# Patient Record
Sex: Male | Born: 2000 | Race: Black or African American | Hispanic: No | Marital: Single | Smoking: Never smoker
Health system: Southern US, Community
[De-identification: ages and names within clinical notes are randomized; demographics above are authoritative.]

---
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
## 2020-08-13 ENCOUNTER — Encounter: Payer: Self-pay | Admitting: Emergency Medicine

## 2020-08-13 ENCOUNTER — Emergency Department
Admission: EM | Admit: 2020-08-13 | Discharge: 2020-08-14 | Disposition: A | Discharge status: Home / Self Care | Payer: Medicaid Other | Attending: Student in an Organized Health Care Education/Training Program | Admitting: Student in an Organized Health Care Education/Training Program

## 2020-08-13 ENCOUNTER — Other Ambulatory Visit: Payer: Self-pay

## 2020-08-13 DIAGNOSIS — Z20822 Contact with and (suspected) exposure to covid-19: Secondary | ICD-10-CM | POA: Insufficient documentation

## 2020-08-13 DIAGNOSIS — F1290 Cannabis use, unspecified, uncomplicated: Secondary | ICD-10-CM | POA: Diagnosis not present

## 2020-08-13 DIAGNOSIS — F332 Major depressive disorder, recurrent severe without psychotic features: Secondary | ICD-10-CM | POA: Diagnosis not present

## 2020-08-13 DIAGNOSIS — R45851 Suicidal ideations: Secondary | ICD-10-CM

## 2020-08-13 DIAGNOSIS — R259 Unspecified abnormal involuntary movements: Secondary | ICD-10-CM | POA: Diagnosis present

## 2020-08-13 LAB — URINE DRUG SCREEN, QUALITATIVE (ARMC ONLY)
Amphetamines, Ur Screen: NOT DETECTED
Barbiturates, Ur Screen: NOT DETECTED
Benzodiazepine, Ur Scrn: NOT DETECTED
Cannabinoid 50 Ng, Ur ~~LOC~~: POSITIVE — AB
Cocaine Metabolite,Ur ~~LOC~~: NOT DETECTED
MDMA (Ecstasy)Ur Screen: NOT DETECTED
Methadone Scn, Ur: NOT DETECTED
Opiate, Ur Screen: NOT DETECTED
Phencyclidine (PCP) Ur S: NOT DETECTED
Tricyclic, Ur Screen: NOT DETECTED

## 2020-08-13 LAB — COMPREHENSIVE METABOLIC PANEL
ALT: 55 U/L — ABNORMAL HIGH (ref 0–44)
AST: 30 U/L (ref 15–41)
Albumin: 4.5 g/dL (ref 3.5–5.0)
Alkaline Phosphatase: 86 U/L (ref 38–126)
Anion gap: 6 (ref 5–15)
BUN: 17 mg/dL (ref 6–20)
CO2: 26 mmol/L (ref 22–32)
Calcium: 9.6 mg/dL (ref 8.9–10.3)
Chloride: 107 mmol/L (ref 98–111)
Creatinine, Ser: 0.97 mg/dL (ref 0.61–1.24)
GFR, Estimated: >60 mL/min (ref >60)
Glucose, Bld: 100 mg/dL — ABNORMAL HIGH (ref 70–99)
Potassium: 4.3 mmol/L (ref 3.5–5.1)
Sodium: 139 mmol/L (ref 135–145)
Total Bilirubin: 0.7 mg/dL (ref 0.3–1.2)
Total Protein: 7.7 g/dL (ref 6.5–8.1)

## 2020-08-13 LAB — CBC
HCT: 43.3 % (ref 39.0–52.0)
Hemoglobin: 13.7 g/dL (ref 13.0–17.0)
MCH: 27.6 pg (ref 26.0–34.0)
MCHC: 31.6 g/dL (ref 30.0–36.0)
MCV: 87.1 fL (ref 80.0–100.0)
Platelets: 251 10*3/uL (ref 150–400)
RBC: 4.97 MIL/uL (ref 4.22–5.81)
RDW: 12 % (ref 11.5–15.5)
WBC: 5.2 10*3/uL (ref 4.0–10.5)
nRBC: 0 % (ref 0.0–0.2)

## 2020-08-13 LAB — ACETAMINOPHEN LEVEL: Acetaminophen (Tylenol), Serum: <10 ug/mL — ABNORMAL LOW (ref 10–30)

## 2020-08-13 LAB — SALICYLATE LEVEL: Salicylate Lvl: <7 mg/dL — ABNORMAL LOW (ref 7.0–30.0)

## 2020-08-13 LAB — ETHANOL: Alcohol, Ethyl (B): <10 mg/dL (ref <10)

## 2020-08-13 NOTE — ED Triage Notes (Addendum)
Pt to ED from home under IVC with BPD with papers that state patient wanted to commit suicide, locked himself in his room and when confronted had pill bottles open around him and some empty.  Upon arrival patient uncooperative and refusing to answer questions.  Not answering if did this to harm self, states had the door locked "because it's my room and I can do whatever the hell I like".  Pt states to this RN "you making me feel like I want to hurt myself with all these questions."  Pt dressed out in triage by this RN and Brayton Caves, EDT into hospital appropriate scrubs.  Belongings placed into bag that include: blue sweat shirt, white tank top, gray sweat pants, blue boxers, colorful slip on shoes, black ear rings, white bracelet, 2 white necklaces, 1 cell phone.

## 2020-08-13 NOTE — ED Notes (Signed)
ED Provider at bedside. 

## 2020-08-13 NOTE — ED Provider Notes (Signed)
[facility] Emergency Department Provider Note    Event Date/Time   First MD Initiated Contact with Patient 08/13/20 2303     (approximate)  I have reviewed the triage vital signs and the nursing notes.   HISTORY  Chief Complaint Suicidal  Level V Caveat:  Patient refusing to provide history  HPI Miguel Smith is a 20 y.o. male presents to the ER for evaluation under IVC due to reported suicidal ideation.  Patient refusing to provide any additional history or speak on matter for which he was brought to the ER.  States "you should have done your homework."    History reviewed. No pertinent past medical history. History reviewed. No pertinent family history. History reviewed. No pertinent surgical history. There are no problems to display for this patient.     Prior to Admission medications   Not on File    Allergies Patient has no known allergies.    Social History Social History   Tobacco Use  . Smoking status: Never Smoker  . Smokeless tobacco: Never Used  Substance Use Topics  . Alcohol use: No  . Drug use: Yes    Types: Marijuana    Review of Systems Patient denies headaches, rhinorrhea, blurry vision, numbness, shortness of breath, chest pain, edema, cough, abdominal pain, nausea, vomiting, diarrhea, dysuria, fevers, rashes or hallucinations unless otherwise stated above in HPI. ____________________________________________   PHYSICAL EXAM:  VITAL SIGNS: Vitals:   08/13/20 2239  BP: (!) 144/88  Pulse: 81  Resp: 16  Temp: 98.4 F (36.9 C)  SpO2: 99%    Constitutional: Alert and oriented.  Eyes: Conjunctivae are normal.  Head: Atraumatic. Nose: No congestion/rhinnorhea. Mouth/Throat: Mucous membranes are moist.   Neck: No stridor. Painless ROM.  Cardiovascular: well perfused. Grossly normal heart sounds.  Respiratory: Normal respiratory effort.  No retractions.  Gastrointestinal: Soft and nontender. No  distention.  Genitourinary:  Musculoskeletal: No lower extremity tenderness nor edema.  No joint effusions. Neurologic:  Normal speech and language. No gross focal neurologic deficits are appreciated. No facial droop Skin:  Skin is warm, dry and intact. No rash noted. Psychiatric: calm, withdrawn, uncooperative ____________________________________________   LABS (all labs ordered are listed, but only abnormal results are displayed)  Results for orders placed or performed during the hospital encounter of 08/13/20 (from the past 24 hour(s))  Comprehensive metabolic panel     Status: Abnormal   Collection Time: 08/13/20 10:45 PM  Result Value Ref Range   Sodium 139 135 - 145 mmol/L   Potassium 4.3 3.5 - 5.1 mmol/L   Chloride 107 98 - 111 mmol/L   CO2 26 22 - 32 mmol/L   Glucose, Bld 100 (H) 70 - 99 mg/dL   BUN 17 6 - 20 mg/dL   Creatinine, Ser 0.94 0.61 - 1.24 mg/dL   Calcium 9.6 8.9 - 07.6 mg/dL   Total Protein 7.7 6.5 - 8.1 g/dL   Albumin 4.5 3.5 - 5.0 g/dL   AST 30 15 - 41 U/L   ALT 55 (H) 0 - 44 U/L   Alkaline Phosphatase 86 38 - 126 U/L   Total Bilirubin 0.7 0.3 - 1.2 mg/dL   GFR, Estimated >80 >88 mL/min   Anion gap 6 5 - 15  Ethanol     Status: None   Collection Time: 08/13/20 10:45 PM  Result Value Ref Range   Alcohol, Ethyl (B) <10 <10 mg/dL  cbc     Status: None   Collection Time:  08/13/20 10:45 PM  Result Value Ref Range   WBC 5.2 4.0 - 10.5 K/uL   RBC 4.97 4.22 - 5.81 MIL/uL   Hemoglobin 13.7 13.0 - 17.0 g/dL   HCT 33.8 25.0 - 53.9 %   MCV 87.1 80.0 - 100.0 fL   MCH 27.6 26.0 - 34.0 pg   MCHC 31.6 30.0 - 36.0 g/dL   RDW 76.7 34.1 - 93.7 %   Platelets 251 150 - 400 K/uL   nRBC 0.0 0.0 - 0.2 %   ____________________________________________ ____________________________________________  RADIOLOGY   ____________________________________________   PROCEDURES  Procedure(s) performed:  Procedures    Critical Care performed:  no ____________________________________________   INITIAL IMPRESSION / ASSESSMENT AND PLAN / ED COURSE  Pertinent labs & imaging results that were available during my care of the patient were reviewed by me and considered in my medical decision making (see chart for details).   DDX: Psychosis, delirium, medication effect, noncompliance, polysubstance abuse, Si, Hi, depression   [name] is a 20 y.o. who presents to the ED with for evaluation of SI.  Laboratory testing was ordered to evaluation for underlying electrolyte derangement or signs of underlying organic pathology to explain today's presentation.  Based on history and physical and laboratory evaluation, it appears that the patient's presentation is 2/2 underlying psychiatric disorder and will require further evaluation and management by inpatient psychiatry.  Patient was  made an IVC PTA.  Disposition pending psychiatric evaluation.  The patient has been placed in psychiatric observation due to the need to provide a safe environment for the patient while obtaining psychiatric consultation and evaluation, as well as ongoing medical and medication management to treat the patient's condition.  The patient has been placed under full IVC at this time.     The patient was evaluated in Emergency Department today for the symptoms described in the history of present illness. He/she was evaluated in the context of the global COVID-19 pandemic, which necessitated consideration that the patient might be at risk for infection with the SARS-CoV-2 virus that causes COVID-19. Institutional protocols and algorithms that pertain to the evaluation of patients at risk for COVID-19 are in a state of rapid change based on information released by regulatory bodies including the CDC and federal and state organizations. These policies and algorithms were followed during the patient's care in the ED.  As part of my medical decision making, I reviewed  the following data within the electronic MEDICAL RECORD NUMBER Nursing notes reviewed and incorporated, Labs reviewed, notes from prior ED visits and [place] Controlled Substance Database   ____________________________________________   FINAL CLINICAL IMPRESSION(S) / ED DIAGNOSES  Final diagnoses:  Suicidal ideation      NEW MEDICATIONS STARTED DURING THIS VISIT:  New Prescriptions   No medications on file     Note:  This document was prepared using Dragon voice recognition software and may include unintentional dictation errors.    Willy Eddy, MD 08/13/20 2322

## 2020-08-13 NOTE — ED Notes (Signed)
Pt ambulatory to room. Currently calm.

## 2020-08-14 ENCOUNTER — Inpatient Hospital Stay
Admission: AD | Admit: 2020-08-14 | Discharge: 2020-08-16 | DRG: 885 | Disposition: A | Discharge status: Home / Self Care | Payer: Medicaid Other | Source: Intra-hospital | Attending: Behavioral Health | Admitting: Behavioral Health

## 2020-08-14 ENCOUNTER — Encounter: Payer: Self-pay | Admitting: Behavioral Health

## 2020-08-14 DIAGNOSIS — F1220 Cannabis dependence, uncomplicated: Secondary | ICD-10-CM | POA: Diagnosis present

## 2020-08-14 DIAGNOSIS — F1210 Cannabis abuse, uncomplicated: Secondary | ICD-10-CM

## 2020-08-14 DIAGNOSIS — Z20822 Contact with and (suspected) exposure to covid-19: Secondary | ICD-10-CM | POA: Diagnosis not present

## 2020-08-14 DIAGNOSIS — R45851 Suicidal ideations: Secondary | ICD-10-CM | POA: Diagnosis present

## 2020-08-14 DIAGNOSIS — G4710 Hypersomnia, unspecified: Secondary | ICD-10-CM | POA: Diagnosis present

## 2020-08-14 DIAGNOSIS — F332 Major depressive disorder, recurrent severe without psychotic features: Principal | ICD-10-CM | POA: Diagnosis present

## 2020-08-14 LAB — RESP PANEL BY RT-PCR (FLU A&B, COVID) ARPGX2
Influenza A by PCR: NEGATIVE
Influenza B by PCR: NEGATIVE
SARS Coronavirus 2 by RT PCR: NEGATIVE

## 2020-08-14 MED ORDER — ZIPRASIDONE MESYLATE 20 MG IM SOLR: 20.0000 mg | Freq: Three times a day (TID) | INTRAMUSCULAR | Status: DC | PRN

## 2020-08-14 MED ORDER — HYDROXYZINE HCL 25 MG PO TABS: 25.0000 mg | ORAL_TABLET | Freq: Four times a day (QID) | ORAL | Status: DC | PRN

## 2020-08-14 MED ORDER — ACETAMINOPHEN 325 MG PO TABS
650.0000 mg | ORAL_TABLET | Freq: Four times a day (QID) | ORAL | Status: DC | PRN
Start: 2020-08-14 — End: 2020-08-16

## 2020-08-14 MED ORDER — ZIPRASIDONE HCL 20 MG PO CAPS: 20.0000 mg | ORAL_CAPSULE | Freq: Three times a day (TID) | ORAL | Status: DC | PRN

## 2020-08-14 MED ORDER — ALUM & MAG HYDROXIDE-SIMETH 200-200-20 MG/5ML PO SUSP: 30.0000 mL | ORAL | Status: DC | PRN

## 2020-08-14 MED ORDER — MAGNESIUM HYDROXIDE 400 MG/5ML PO SUSP: 30.0000 mL | Freq: Every day | ORAL | Status: DC | PRN

## 2020-08-14 MED ORDER — ARIPIPRAZOLE 5 MG PO TABS
5.0000 mg | ORAL_TABLET | Freq: Every day | ORAL | Status: DC
  Filled 2020-08-14: qty 1

## 2020-08-14 NOTE — Progress Notes (Signed)
Pt denies depression, anxiety, SI, HI and AVH. Pt was educated on care plan and verbalizes understanding. Shukri Nistler RN 

## 2020-08-14 NOTE — Tx Team (Signed)
Initial Treatment Plan 08/14/2020 5:03 AM Bary Leriche CWC:[id number]    PATIENT STRESSORS: Marital or family conflict Occupational concerns   PATIENT STRENGTHS: Average or above average intelligence Supportive family/friends   PATIENT IDENTIFIED PROBLEMS: Suicidal Thoughts  Marijuana Use                   DISCHARGE CRITERIA:  Improved stabilization in mood, thinking, and/or behavior  PRELIMINARY DISCHARGE PLAN: Outpatient therapy Return to previous living arrangement  PATIENT/FAMILY INVOLVEMENT: This treatment plan has been presented to and reviewed with the patient, KASTON FAUGHN, and/or family member.  The patient and family have been given the opportunity to ask questions and make suggestions.  Elbert Ewings, RN 08/14/2020, 5:03 AM

## 2020-08-14 NOTE — [facility] Group Notes (Signed)
LCSW Group Therapy Note  08/14/2020 2:43 PM  Type of Therapy/Topic:  Group Therapy:  Emotion Regulation  Participation Level:  Did Not Attend   Description of Group:   The purpose of this group is to assist patients in learning to regulate negative emotions and experience positive emotions. Patients will be guided to discuss ways in which they have been vulnerable to their negative emotions. These vulnerabilities will be juxtaposed with experiences of positive emotions or situations, and patients will be challenged to use positive emotions to combat negative ones. Special emphasis will be placed on coping with negative emotions in conflict situations, and patients will process healthy conflict resolution skills.  Therapeutic Goals: 1. Patient will identify two positive emotions or experiences to reflect on in order to balance out negative emotions 2. Patient will label two or more emotions that they find the most difficult to experience 3. Patient will demonstrate positive conflict resolution skills through discussion and/or role plays  Summary of Patient Progress: X  Therapeutic Modalities:   Cognitive Behavioral Therapy Feelings Identification Dialectical Behavioral Therapy  Penni Homans, MSW, LCSW 08/14/2020 2:43 PM
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
## 2020-08-14 NOTE — BH Assessment (Signed)
Patient is to be admitted to [facility] by Psychiatric Nurse Practitioner Gillermo Murdoch.  Attending Physician will be Dr. Les Pou.   Patient has been assigned to room 324, by [facility] Charge Nurse Galva.    ER staff is aware of the admission:  [facility] ER Secretary    Dr. Roxan Hockey, ER MD   Selena Batten Patient's Nurse   Rosey Bath Patient Access.
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
## 2020-08-14 NOTE — BH Assessment (Signed)
Comprehensive Clinical Assessment (CCA) Note  08/14/2020 Miguel Smith [id number]  Chief Complaint: Patient is a 20 year old male presenting to [facility] ED under IVC. Per triage note Pt to ED from home under IVC with BPD with papers that state patient wanted to commit suicide, locked himself in his room and when confronted had pill bottles open around him and some empty.  Upon arrival patient uncooperative and refusing to answer questions.  Not answering if did this to harm self, states had the door locked "because it's my room and I can do whatever the hell I like".  Pt states to this RN "you making me feel like I want to hurt myself with all these questions." During assessment patient appears alert and oriented x4, calm and cooperative. Patient reports "I got into a argument with the people I live with, I live my mom and brother." Patient denies having any pills in his room, he denies making any comments to want to hurt himself. Patient reports not being engaged with therapy or a psychiatrist currently but does report having some type of therapy "2 years ago with my family, my brother suggested we take therapy classes but I don't know why." Patient does report smoking marijuana daily "I just like smoking weed." Patient UDS is positive for Cannabinoids. Patient denies SI/HI/AH/VH and does not appear to be responding to any internal or external stimuli.  Per Psyc NP Elenore Paddy patient is recommended for Inpatient Hospitalization  Chief Complaint  Patient presents with  . Suicidal   Visit Diagnosis: Major Depressive Disorder, severe   CCA Screening, Triage and Referral (STR)  Patient Reported Information How did you hear about [place]? Legal System  Referral name: No data recorded Referral phone number: No data recorded  Whom do you see for routine medical problems? Other (Comment)  Practice/Facility Name: No data recorded Practice/Facility Phone Number: No data recorded Name of Contact:  No data recorded Contact Number: No data recorded Contact Fax Number: No data recorded Prescriber Name: No data recorded Prescriber Address (if known): No data recorded  What Is the Reason for Your Visit/Call Today? No data recorded How Long Has This Been Causing You Problems? > than 6 months  What Do You Feel Would Help You the Most Today? Assessment Only; Therapy   Have You Recently Been in Any Inpatient Treatment (Hospital/Detox/Crisis Center/28-Day Program)? No  Name/Location of Program/Hospital:No data recorded How Long Were You There? No data recorded When Were You Discharged? No data recorded  Have You Ever Received Services From [facility] Before? No  Who Do You See at [facility]? No data recorded  Have You Recently Had Any Thoughts About Hurting Yourself? No  Are You Planning to Commit Suicide/Harm Yourself At This time? No   Have you Recently Had Thoughts About Hurting Someone [name]? No  Explanation: No data recorded  Have You Used Any Alcohol or Drugs in the Past 24 Hours? Yes  How Long Ago Did You Use Drugs or Alcohol? 0055  What Did You Use and How Much? Marijuana   Do You Currently Have a Therapist/Psychiatrist? No  Name of Therapist/Psychiatrist: No data recorded  Have You Been Recently Discharged From Any Office Practice or Programs? No  Explanation of Discharge From Practice/Program: No data recorded    CCA Screening Triage Referral Assessment Type of Contact: Face-to-Face  Is this Initial or Reassessment? No data recorded Date Telepsych consult ordered in [facility]:  No data recorded Time Telepsych consult ordered in [facility]:  No data recorded  Patient Reported Information Reviewed? Yes  Patient Left Without Being Seen? No data recorded Reason for Not Completing Assessment: No data recorded  Collateral Involvement: No data recorded  Does Patient Have a Court Appointed Legal Guardian? No data recorded Name and Contact of Legal Guardian: No data  recorded If Minor and Not Living with Parent(s), Who has Custody? No data recorded Is CPS involved or ever been involved? Never  Is APS involved or ever been involved? Never   Patient Determined To Be At Risk for Harm To Self or Others Based on Review of Patient Reported Information or Presenting Complaint? No  Method: No data recorded Availability of Means: No data recorded Intent: No data recorded Notification Required: No data recorded Additional Information for Danger to Others Potential: No data recorded Additional Comments for Danger to Others Potential: No data recorded Are There Guns or Other Weapons in Your Home? No data recorded Types of Guns/Weapons: No data recorded Are These Weapons Safely Secured?                            No data recorded Who Could Verify You Are Able To Have These Secured: No data recorded Do You Have any Outstanding Charges, Pending Court Dates, Parole/Probation? No data recorded Contacted To Inform of Risk of Harm To Self or Others: No data recorded  [facility]   Does Patient Present under Involuntary Commitment? No data recorded IVC Papers Initial File Date: No data recorded  [place]   Patient Currently Receiving the Following Services: No data recorded  Determination of Need: Emergent (2 hours)   Options For Referral: No data recorded    CCA Biopsychosocial Intake/Chief Complaint:  Patient presenting to the ED under IVC due to reporting SI to his mother  Current Symptoms/Problems: Patient presenting to the ED under IVC due to reporting SI to his mother   Patient Reported Schizophrenia/Schizoaffective Diagnosis in Past: No   Strengths: Patient is able to communicate effectively  Preferences: Unknown  Abilities: Patient was athletic in high school   Type of Services Patient Feels are Needed: None   Initial Clinical Notes/Concerns: None   Mental Health Symptoms Depression:   Irritability   Duration of Depressive symptoms: No data recorded  Mania:  None   Anxiety:   Irritability; Worrying   Psychosis:  None   Duration of Psychotic symptoms: No data recorded  Trauma:  None   Obsessions:  None   Compulsions:  None   Inattention:  None   Hyperactivity/Impulsivity:  N/A   Oppositional/Defiant Behaviors:  None   Emotional Irregularity:  None   Other Mood/Personality Symptoms:  No data recorded   Mental Status Exam Appearance and self-care  Stature:  Tall   Weight:  Average weight   Clothing:  Casual   Grooming:  Normal   Cosmetic use:  None   Posture/gait:  Normal   Motor activity:  Not Remarkable   Sensorium  Attention:  Normal   Concentration:  Normal   Orientation:  X5   Recall/memory:  Normal   Affect and Mood  Affect:  Congruent   Mood:  Other (Comment)   Relating  Eye contact:  Normal   Facial expression:  Responsive   Attitude toward examiner:  Cooperative   Thought and Language  Speech flow: Clear and Coherent   Thought content:  Appropriate to Mood and Circumstances   Preoccupation:  None  Hallucinations:  None   Organization:  No data recorded  [employer] of Knowledge:  Fair   Intelligence:  Average   Abstraction:  Normal   Judgement:  Fair   Brewing technologist   Insight:  Lacking   Decision Making:  Normal   Social Functioning  Social Maturity:  Responsible   Social Judgement:  Normal   Stress  Stressors:  Family conflict   Coping Ability:  Normal   Skill Deficits:  None   Supports:  Family     Religion: Religion/Spirituality Are You A Religious Person?: No  Leisure/Recreation: Leisure / Recreation Do You Have Hobbies?: No  Exercise/Diet: Exercise/Diet Do You Exercise?: No Have You Gained or Lost A Significant Amount of Weight in the Past Six Months?: No Do You Follow a Special Diet?: No Do You Have Any Trouble Sleeping?: No   CCA  Employment/Education Employment/Work Situation: Employment / Work Situation Employment situation: Biomedical scientist job has been impacted by current illness: No What is the longest time patient has a held a job?: Unknown Where was the patient employed at that time?: Unknown Has patient ever been in the [employer]?: No  Education: Education Is Patient Currently Attending School?: No Did Garment/textile technologist From [employer]?: Yes Did Theme park manager?: No Did Designer, television/film set?: No Did You Have An Individualized Education Program (IIEP): No Did You Have Any Difficulty At [employer]?: No Patient's Education Has Been Impacted by Current Illness: No   CCA Family/Childhood History Family and Relationship History: Family history Marital status: Single Are you sexually active?:  (Unknown) What is your sexual orientation?: Heterosexual Has your sexual activity been affected by drugs, alcohol, medication, or emotional stress?: Unknown Does patient have children?: Yes How many children?: 1 How is patient's relationship with their children?: Patient has a 2year old son, reports relationship with son is good  Childhood History:  Childhood History By whom was/is the patient raised?: Mother Additional childhood history information: None reported Description of patient's relationship with caregiver when they were a child: None reported Patient's description of current relationship with people who raised him/her: None reported How were you disciplined when you got in trouble as a child/adolescent?: None reported Does patient have siblings?: Yes Number of Siblings: 1 Description of patient's current relationship with siblings: Patient reports having a brother and relatinoship iw good Did patient suffer any verbal/emotional/physical/sexual abuse as a child?: No Did patient suffer from severe childhood neglect?: No Has patient ever been sexually abused/assaulted/raped as an adolescent or  adult?: No Was the patient ever a victim of a crime or a disaster?: No Witnessed domestic violence?: No Has patient been affected by domestic violence as an adult?: No  Child/Adolescent Assessment:     CCA Substance Use Alcohol/Drug Use: Alcohol / Drug Use Pain Medications: See MAR Prescriptions: See MAR Over the Counter: See MAR History of alcohol / drug use?: Yes Substance #1 Name of Substance 1: Marijuana 1 - Frequency: daily 1- Route of Use: smoking                       ASAM's:  Six Dimensions of Multidimensional Assessment  Dimension 1:  Acute Intoxication and/or Withdrawal Potential:      Dimension 2:  Biomedical Conditions and Complications:      Dimension 3:  Emotional, Behavioral, or Cognitive Conditions and Complications:     Dimension 4:  Readiness to Change:     Dimension 5:  Relapse, Continued  use, or Continued Problem Potential:     Dimension 6:  Recovery/Living Environment:     ASAM Severity Score:    ASAM Recommended Level of Treatment:     Substance use Disorder (SUD)    Recommendations for Services/Supports/Treatments:   Per Psyc NP Elenore Paddy patient is recommended for Inpatient Hospitalization   DSM5 Diagnoses: Patient Active Problem List   Diagnosis Date Noted  . MDD (major depressive disorder), recurrent episode, severe (HCC) 08/14/2020    Patient Centered Plan: Patient is on the following Treatment Plan(s):  Depression   Referrals to Alternative Service(s): Referred to Alternative Service(s):   Place:   Date:   Time:    Referred to Alternative Service(s):   Place:   Date:   Time:    Referred to Alternative Service(s):   Place:   Date:   Time:    Referred to Alternative Service(s):   Place:   Date:   Time:     Valary Manahan A Hakeem Frazzini, LCAS-A

## 2020-08-14 NOTE — Plan of Care (Signed)
Patient newly admitted to inpatient unit and he requires stabilization of psychiatric symptoms.
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
## 2020-08-14 NOTE — ED Notes (Signed)
Per TTS pt being transfer down to BMU room 324 from [facility] 4

## 2020-08-14 NOTE — Progress Notes (Signed)
Patient admitted to [facility] under IVC after per report at home patient voiced suicidal thoughts at home after getting in to a verbal altercation with his mother and brother. Upon arrival to hospital he denies all psychiatric symptoms or any suicidal thoughts or any hallucinations. He was cooperative with treatment and no contraband found upon admission. He appears to be in the bed resting quietly.
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
## 2020-08-14 NOTE — Progress Notes (Signed)
Recreation Therapy Notes  Date: 08/14/2020  Time: 9:30 am   Location: Craft room    Behavioral response: N/A   Intervention Topic: Happiness    Discussion/Intervention: Patient did not attend group.   Clinical Observations/Feedback:  Patient did not attend group.   Martrice Apt LRT/CTRS        Hawa Henly 08/14/2020 4:19 PM

## 2020-08-14 NOTE — ED Notes (Signed)
Pt. To BHU from ED ambulatory without difficulty, to room  BHU4. Report from Stephen RN. Pt. Is alert and oriented, warm and dry in no distress. Pt. Denies SI, HI, and AVH. Pt. Calm and cooperative. Pt. Made aware of security cameras and Q15 minute rounds. Pt. Encouraged to let Nursing staff know of any concerns or needs.   ENVIRONMENTAL ASSESSMENT Potentially harmful objects out of patient reach: Yes.   Personal belongings secured: Yes.   Patient dressed in hospital provided attire only: Yes.   Plastic bags out of patient reach: Yes.   Patient care equipment (cords, cables, call bells, lines, and drains) shortened, removed, or accounted for: Yes.   Equipment and supplies removed from bottom of stretcher: Yes.   Potentially toxic materials out of patient reach: Yes.   Sharps container removed or out of patient reach: Yes.     

## 2020-08-14 NOTE — Tx Team (Addendum)
Interdisciplinary Treatment and Diagnostic Plan Update  08/14/2020 Time of Session: 9:00AM Miguel Smith MRN: [id number]  Principal Diagnosis: <principal problem not specified>  Secondary Diagnoses: Active Problems:   MDD (major depressive disorder), recurrent episode, severe (HCC)   Current Medications:  Current Facility-Administered Medications  Medication Dose Route Frequency Provider Last Rate Last Admin  . acetaminophen (TYLENOL) tablet 650 mg  650 mg Oral Q6H PRN Caroline Sauger, NP      . alum & mag hydroxide-simeth (MAALOX/MYLANTA) 200-200-20 MG/5ML suspension 30 mL  30 mL Oral Q4H PRN Caroline Sauger, NP      . hydrOXYzine (ATARAX/VISTARIL) tablet 25 mg  25 mg Oral Q6H PRN Caroline Sauger, NP      . magnesium hydroxide (MILK OF MAGNESIA) suspension 30 mL  30 mL Oral Daily PRN Caroline Sauger, NP       PTA Medications: No medications prior to admission.    Patient Stressors: Marital or family conflict Occupational concerns  Patient Strengths: Average or above average intelligence Supportive family/friends  Treatment Modalities: Medication Management, Group therapy, Case management,  1 to 1 session with clinician, Psychoeducation, Recreational therapy.   Physician Treatment Plan for Primary Diagnosis: <principal problem not specified> Long Term Goal(s):     Short Term Goals:    Medication Management: Evaluate patient's response, side effects, and tolerance of medication regimen.  Therapeutic Interventions: 1 to 1 sessions, Unit Group sessions and Medication administration.  Evaluation of Outcomes: Not Met  Physician Treatment Plan for Secondary Diagnosis: Active Problems:   MDD (major depressive disorder), recurrent episode, severe ([place])  Long Term Goal(s):     Short Term Goals:       Medication Management: Evaluate patient's response, side effects, and tolerance of medication regimen.  Therapeutic Interventions: 1 to 1 sessions,  Unit Group sessions and Medication administration.  Evaluation of Outcomes: Not Met   RN Treatment Plan for Primary Diagnosis: <principal problem not specified> Long Term Goal(s): Knowledge of disease and therapeutic regimen to maintain health will improve  Short Term Goals: Ability to remain free from injury will improve, Ability to verbalize frustration and anger appropriately will improve, Ability to demonstrate self-control, Ability to participate in decision making will improve, Ability to verbalize feelings will improve, Ability to disclose and discuss suicidal ideas, Ability to identify and develop effective coping behaviors will improve and Compliance with prescribed medications will improve  Medication Management: RN will administer medications as ordered by provider, will assess and evaluate patient's response and provide education to patient for prescribed medication. RN will report any adverse and/or side effects to prescribing provider.  Therapeutic Interventions: 1 on 1 counseling sessions, Psychoeducation, Medication administration, Evaluate responses to treatment, Monitor vital signs and CBGs as ordered, Perform/monitor CIWA, COWS, AIMS and Fall Risk screenings as ordered, Perform wound care treatments as ordered.  Evaluation of Outcomes: Not Met   LCSW Treatment Plan for Primary Diagnosis: <principal problem not specified> Long Term Goal(s): Safe transition to appropriate next level of care at discharge, Engage patient in therapeutic group addressing interpersonal concerns.  Short Term Goals: Engage patient in aftercare planning with referrals and resources, Increase social support, Increase ability to appropriately verbalize feelings, Increase emotional regulation, Facilitate acceptance of mental health diagnosis and concerns, Facilitate patient progression through stages of change regarding substance use diagnoses and concerns, Identify triggers associated with mental  health/substance abuse issues and Increase skills for wellness and recovery  Therapeutic Interventions: Assess for all discharge needs, 1 to 1 time with Social worker, Explore available resources  and support systems, Assess for adequacy in community support network, Educate family and significant other(s) on suicide prevention, Complete Psychosocial Assessment, Interpersonal group therapy.  Evaluation of Outcomes: Not Met   Progress in Treatment: Attending groups: No. Participating in groups: No. Taking medication as prescribed: Yes. Toleration medication: Yes. Family/Significant other contact made: No, will contact:  when permission is given Patient understands diagnosis: No. Discussing patient identified problems/goals with staff: Yes. Medical problems stabilized or resolved: Yes. Denies suicidal/homicidal ideation: Yes. Issues/concerns per patient self-inventory: No. Other: None  New problem(s) identified: No, Describe:  None  New Short Term/Long Term Goal(s):   Engage patient in aftercare planning with referrals and resources, Increase social support, Increase ability to appropriately verbalize feelings, Increase emotional regulation, Facilitate acceptance of mental health diagnosis and concerns, Facilitate patient progression through stages of change regarding substance use diagnoses and concerns, Identify triggers associated with mental health/substance abuse issues and Increase skills for wellness and recovery   Patient Goals:  'I really just wanna leave"  Discharge Plan or Barriers: CSW will assist pt in obtaining follow up care and transportation.  Reason for Continuation of Hospitalization: Depression Medication stabilization Suicidal ideation  Estimated Length of Stay: 1-7 days  Recreational Therapy: Patient Stressors: N/A Patient Goal: Patient will engage in groups without prompting or encouragement from LRT x3 group sessions within 5 recreation therapy group  sessions.  Attendees: Patient: Miguel Smith 08/14/2020 10:31 AM  Physician: Salley Scarlet, MD 08/14/2020 10:31 AM  Nursing:  08/14/2020 10:31 AM  RN Care Manager: 08/14/2020 10:31 AM  Social Worker: Assunta Curtis, MSW, LCSW 08/14/2020 10:31 AM  Recreational Therapist: Roanna Epley, Reather Converse, LRT  08/14/2020 10:31 AM  Other: Kiva Martinique, MSW, LCSW-A 08/14/2020 10:31 AM  Other: Michell Heinrich, MSW, LCSW, LCAS 08/14/2020 10:31 AM  Other: 08/14/2020 10:31 AM    Scribe for Treatment Team: Kiva A [place], [place] 08/14/2020 10:31 AM

## 2020-08-14 NOTE — BHH Counselor (Signed)
CSW attempted to complete patient's assessment.  Patient declined stating that he is "about to be discharged from the hospital and there is no need".  CSW pointed out that psychiatrist has not updated staff so discharge has not been confirmed as of yet.  Penni Homans, MSW, LCSW 08/14/2020 10:40 AM

## 2020-08-15 LAB — LIPID PANEL
Cholesterol: 161 mg/dL (ref 0–200)
HDL: 57 mg/dL (ref >40)
LDL Cholesterol: 94 mg/dL (ref 0–99)
Total CHOL/HDL Ratio: 2.8 RATIO
Triglycerides: 49 mg/dL (ref <150)
VLDL: 10 mg/dL (ref 0–40)

## 2020-08-15 LAB — HEMOGLOBIN A1C
Hgb A1c MFr Bld: 5.4 % (ref 4.8–5.6)
Mean Plasma Glucose: 108.28 mg/dL

## 2020-08-15 NOTE — Plan of Care (Signed)
Patient watching TV. Ate dinner. Denies SI,HI and AVH. Refused meds again states " I don't need any medicines." Explained to patient that refusal of medicine  will not help discharge. No aggressive behaviors noted. Support and encouragement given.

## 2020-08-15 NOTE — Progress Notes (Signed)
Patient refused medication and breakfast. States " I will eat when I get home. I don't need any medicine. I did not do anything to come here." Patient is  withdrawn to his room. Support and encouragement given.

## 2020-08-15 NOTE — BHH Counselor (Signed)
CSW attempted to complete PSA with the patient.    Patient stated "If I answer your questions will I get to go home?"  CSW explained that psychiatrist decides the patients discharges."  Patient then stated "well I don't see why I need to answer your questions".    CSW clarified that patient was refusing and stated that this was his right.  CSW pointed out that defiance and refusal are not behaviors expected for a discharge.  Penni Homans, MSW, LCSW 08/15/2020 9:43 AM

## 2020-08-15 NOTE — B[facility] Group Notes (Signed)
LCSW Group Therapy Note  08/15/2020 1:44 PM  Type of Therapy/Topic:  Group Therapy:  Balance in Life  Participation Level:  None  Description of Group:    This group will address the concept of balance and how it feels and looks when one is unbalanced. Patients will be encouraged to process areas in their lives that are out of balance and identify reasons for remaining unbalanced. Facilitators will guide patients in utilizing problem-solving interventions to address and correct the stressor making their life unbalanced. Understanding and applying boundaries will be explored and addressed for obtaining and maintaining a balanced life. Patients will be encouraged to explore ways to assertively make their unbalanced needs known to significant others in their lives, using other group members and facilitator for support and feedback.  Therapeutic Goals: 1. Patient will identify two or more emotions or situations they have that consume much of in their lives. 2. Patient will identify signs/triggers that life has become out of balance:  3. Patient will identify two ways to set boundaries in order to achieve balance in their lives:  4. Patient will demonstrate ability to communicate their needs through discussion and/or role plays  Summary of Patient Progress: Patient was present for the entirety of group. He chose not to engage in the discussion.   Therapeutic Modalities:   Cognitive Behavioral Therapy Solution-Focused Therapy Assertiveness Training  [employer]. Algis Greenhouse, MSW, LCSW, LCAS 08/15/2020 1:44 PM

## 2020-08-15 NOTE — Progress Notes (Signed)
Recreation Therapy Notes   Date: 08/15/2020  Time: 9:30 am   Location: Craft room    Behavioral response: N/A   Intervention Topic: Coping-skills   Discussion/Intervention: Patient did not attend group.   Clinical Observations/Feedback:  Patient did not attend group.   Shaverence Outlaw LRT/CTRS         Shaverence  Outlaw 08/15/2020 12:47 PM
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
## 2020-08-16 NOTE — Progress Notes (Signed)
Recreation Therapy Notes   Date: 08/16/2020  Time: 9:30 am   Location: Craft room    Behavioral response: N/A   Intervention Topic: Goals    Discussion/Intervention: Patient did not attend group.   Clinical Observations/Feedback:  Patient did not attend group.   Johnryan Sao LRT/CTRS        Adriannah Steinkamp 08/16/2020 11:23 AM 

## 2020-08-16 NOTE — Progress Notes (Signed)
  [facility] Adult Case Management Discharge Plan :  Will you be returning to the same living situation after discharge:  Yes,  pt's sister At discharge, do you have transportation home?: Yes,  pt's sister Do you have the ability to pay for your medications: Yes,  Medicaid  Release of information consent forms completed and in the chart;  Patient's signature needed at discharge.  Patient to Follow up at:  Follow-up Information    [facility]. Go on 09/13/2020.   Why: Follow-up appointment scheduled for Friday, April 8th at 11:40am, phone appointment. (in-person and virtual appt not available) Contact information: [address] [place] [address] [phone]               Next level of care provider has access to [facility] Link:no  Safety Planning and Suicide Prevention discussed: Yes,  completed with pt  Have you used any form of tobacco in the last 30 days? (Cigarettes, Smokeless Tobacco, Cigars, and/or Pipes): No  Has patient been referred to the Quitline?: Patient refused referral  Patient has been referred for addiction treatment: N/A  Peg Fifer A [place], LCSWA 08/16/2020, 11:11 AM

## 2020-08-16 NOTE — Discharge Summary (Signed)
Physician Discharge Summary Note  Patient:  Miguel Smith is an 20 y.o., male MRN:  [id number] DOB:  [birth year] Patient phone:  [phone] (home)  Patient address:   [address] [place] [address],  Total Time spent with patient: 35 minutes- 25 minutes face-to-face contact with patient, 10 minutes documentation, coordination of care, scripts  Date of Admission:  08/14/2020 Date of Discharge: 08/16/2020  Reason for Admission:  20 year old male brought in by police after stating he wanted to commit suicide, locked himself in room, and had several empty pill bottles around him.  Principal Problem: MDD (major depressive disorder), recurrent episode, severe (HCC) Discharge Diagnoses: Principal Problem:   MDD (major depressive disorder), recurrent episode, severe (HCC) Active Problems:   Cannabis use disorder, moderate, dependence (HCC)   Past Psychiatric History: History reviewed. No pertinent past medical history. History reviewed. No pertinent surgical history.  Past Medical History: History reviewed. No pertinent past medical history. History reviewed. No pertinent surgical history. Family History: History reviewed. No pertinent family history. Family Psychiatric  History: No pertinent family history Social History:  Social History   Substance and Sexual Activity  Alcohol Use No     Social History   Substance and Sexual Activity  Drug Use Yes  . Types: Marijuana    Social History   Socioeconomic History  . Marital status: Single    Spouse name: Not on file  . Number of children: Not on file  . Years of education: Not on file  . Highest education level: Not on file  Occupational History  . Not on file  Tobacco Use  . Smoking status: Never Smoker  . Smokeless tobacco: Never Used  Substance and Sexual Activity  . Alcohol use: No  . Drug use: Yes    Types: Marijuana  . Sexual activity: Not on file  Other Topics Concern  . Not on file  Social  History Narrative  . Not on file   Social Determinants of Health   Financial Resource Strain: Not on file  Food Insecurity: Not on file  Transportation Needs: Not on file  Physical Activity: Not on file  Stress: Not on file  Social Connections: Not on file    Hospital Course:  20 year old male brought in by police after stating he wanted to commit suicide, locked himself in room, and had several empty pill bottles around him. When he initially arrived to the unit he was closed off only answering minimal questions. However, he eventually opened up to myself and social work team to discuss his life stressors. He notes he feels he would benefit from therapy, but declined medications this admission. He denies suicidal ideations, homicidal ideations, visual hallucinations, and auditory hallucinations. He plans to live with his older sister at discharge. Contacted Dio Giller [phone] prior to discharge. She confirms that patient will be living with her, and she has no concerns about discharge today. 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   Physical Findings: AIMS:  , ,  ,  ,    CIWA:    COWS:     Musculoskeletal: Strength & Muscle Tone: within normal limits Gait & Station: normal Patient leans: N/A   Psychiatric Specialty Exam:  Presentation  General Appearance: Casual  Eye Contact: Good Speech:Normal Rate  Speech Volume:Normal  Handedness:Right   Mood and Affect  Mood:Euthymic Affect:Congruent   Thought Process  Thought Processes:Goal Directed  Descriptions of Associations:Intact  Orientation:Full (Time, Place and  Person)  Thought Content:Logical History of Schizophrenia/Schizoaffective disorder:No  Duration of Psychotic Symptoms:No data recorded Hallucinations:Hallucinations: Other (comment)  Ideas of Reference:None  Suicidal Thoughts:Suicidal Thoughts: No  Homicidal Thoughts:Homicidal Thoughts:  No   Sensorium  Memory:Immediate Fair  Judgment:Intact  Insight:Fair  Executive Functions  Concentration:Fair  Attention Span:Fair  Recall:Fair  Fund of Knowledge:Fair  Language:Fair   Psychomotor Activity  Psychomotor Activity:Psychomotor Activity: Decreased   Assets  Assets:Financial Resources/Insurance; Housing; Leisure Time   Sleep  Sleep:Sleep: Fair    Physical Exam: Physical Exam Vitals and nursing note reviewed.  Constitutional:      Appearance: Normal appearance.  HENT:     Head: Normocephalic and atraumatic.     Right Ear: External ear normal.     Left Ear: External ear normal.     Nose: Nose normal.     Mouth/Throat:     Mouth: Mucous membranes are moist.     Pharynx: Oropharynx is clear.  Eyes:     Extraocular Movements: Extraocular movements intact.     Conjunctiva/sclera: Conjunctivae normal.     Pupils: Pupils are equal, round, and reactive to light.  Cardiovascular:     Rate and Rhythm: Normal rate.     Pulses: Normal pulses.  Pulmonary:     Effort: Pulmonary effort is normal.     Breath sounds: Normal breath sounds.  Abdominal:     General: Abdomen is flat.     Palpations: Abdomen is soft.  Musculoskeletal:        General: No swelling. Normal range of motion.     Cervical back: Normal range of motion and neck supple.  Skin:    General: Skin is warm and dry.  Neurological:     General: No focal deficit present.     Mental Status: He is alert and oriented to person, place, and time.  Psychiatric:        Mood and Affect: Mood normal.        Behavior: Behavior normal.        Thought Content: Thought content normal.        Judgment: Judgment normal.   Review of Systems  Constitutional: Negative for activity change and fatigue.  HENT: Negative for rhinorrhea and sore throat.   Eyes: Negative for photophobia and visual disturbance.  Respiratory: Negative for cough and shortness of breath.   Cardiovascular: Negative for chest  pain and palpitations.  Gastrointestinal: Negative for constipation, diarrhea, nausea and vomiting.  Endocrine: Negative for cold intolerance and heat intolerance.  Genitourinary: Negative for difficulty urinating and dysuria.  Musculoskeletal: Negative for arthralgias and myalgias.  Skin: Negative for rash and wound.  Allergic/Immunologic: Negative for environmental allergies and food allergies.  Neurological: Negative for dizziness and headaches.  Hematological: Negative for adenopathy. Does not bruise/bleed easily.  Psychiatric/Behavioral: Negative for agitation, behavioral problems, hallucinations and suicidal ideas.   Blood pressure 133/80, pulse 68, temperature 98 F (36.7 C), temperature source Oral, resp. rate 18, height 5\' 11"  (1.803 m), weight 104.3 kg, SpO2 100 %. Body mass index is 32.08 kg/m.   Have you used any form of tobacco in the last 30 days? (Cigarettes, Smokeless Tobacco, Cigars, and/or Pipes): No  Has this patient used any form of tobacco in the last 30 days? (Cigarettes, Smokeless Tobacco, Cigars, and/or Pipes) No  Blood Alcohol level:  Lab Results  Component Value Date   ETH <10 08/13/2020    Metabolic Disorder Labs:  Lab Results  Component Value Date   HGBA1C 5.4 08/15/2020  MPG 108.28 08/15/2020   No results found for: PROLACTIN Lab Results  Component Value Date   CHOL 161 08/15/2020   TRIG 49 08/15/2020   HDL 57 08/15/2020   CHOLHDL 2.8 08/15/2020   VLDL 10 08/15/2020   LDLCALC 94 08/15/2020    See Psychiatric Specialty Exam and Suicide Risk Assessment completed by Attending Physician prior to discharge.  Discharge destination:  Home  Is patient on multiple antipsychotic therapies at discharge:  No   Has Patient had three or more failed trials of antipsychotic monotherapy by history:  No  Recommended Plan for Multiple Antipsychotic Therapies: NA  Discharge Instructions    Diet general   Complete by: As directed    Increase activity  slowly   Complete by: As directed      Allergies as of 08/16/2020   No Known Allergies     Medication List    You have not been prescribed any medications.      Follow-up recommendations:  Activity:  as tolerated Diet:  regular diet  Comments:  Patient discharged to live with his sister. He has no medications, and plans to follow up with therapist at [facility]  Signed: Jesse Sans, MD 08/16/2020, 10:51 AM

## 2020-08-16 NOTE — [facility] Suicide Risk Assessment (Signed)
[facility] INPATIENT:  Family/Significant Other Suicide Prevention Education  Suicide Prevention Education:  Patient Refusal for Family/Significant Other Suicide Prevention Education: The patient Miguel Smith has refused to provide written consent for family/significant other to be provided Family/Significant Other Suicide Prevention Education during admission and/or prior to discharge.  Physician notified.  SPE completed with pt, as pt refused to consent to family contact. SPI pamphlet provided to pt and pt was encouraged to share information with support network, ask questions, and talk about any concerns relating to SPE. Pt denies access to guns/firearms and verbalized understanding of information provided. Mobile Crisis information also provided to pt.   Harden Mo 08/16/2020, 10:44 AM

## 2020-08-16 NOTE — B[facility] Counselor (Signed)
Adult Comprehensive Assessment  Patient ID: DEO MEHRINGER, male   DOB: 22-Feb-2001, 20 y.o.   MRN: [id number]  Information Source: Information source: Patient  Current Stressors:  Patient states their primary concerns and needs for treatment are:: "the other night my brother talked to me any type of way, he think he can do that when my mom isn't around, and I picked him up against the wall and they thought I had done something wrong" Patient states their goals for this hospitilization and ongoing recovery are:: "to leave" Educational / Learning stressors: Pt denies. Employment / Job issues: "I got fired two weeks ago" Family Relationships: Pt reprots arguing/aggression with his brother. Financial / Lack of resources (include bankruptcy): Pt denies. Housing / Lack of housing: "Going to live with my sister" Physical health (include injuries & life threatening diseases): Pt denies. Social relationships: Pt denies. Substance abuse: "Marijuana" Bereavement / Loss: Pt denies.  Living/Environment/Situation:  Living Arrangements: Parent Living conditions (as described by patient or guardian): Pt reports that he was living with his mother. Who else lives in the home?: Mother, younger brother, younger sister and son How long has patient lived in current situation?: "2 months" What is atmosphere in current home: Chaotic  Family History:  Marital status: Single Does patient have children?: Yes How many children?: 1 How is patient's relationship with their children?: "I see him 4x a week"  Childhood History:  By whom was/is the patient raised?: Mother Description of patient's relationship with caregiver when they were a child: "She did it by herself but she was stressed" Patient's description of current relationship with people who raised him/her: "not the best" How were you disciplined when you got in trouble as a child/adolescent?: "yelled" Does patient have siblings?: Yes Number of  Siblings: 4 Description of patient's current relationship with siblings: Pt reports he has an older brother and sister and younger brother and sister.  Reports "no problem at all" when asked about relationship. Did patient suffer any verbal/emotional/physical/sexual abuse as a child?: No Did patient suffer from severe childhood neglect?: No Has patient ever been sexually abused/assaulted/raped as an adolescent or adult?: No Was the patient ever a victim of a crime or a disaster?: No Witnessed domestic violence?: No Has patient been affected by domestic violence as an adult?: No  Education:  Highest grade of school patient has completed: "12th" Currently a student?: No Learning disability?: No  Employment/Work Situation:   Employment situation: Unemployed What is the longest time patient has a held a job?: "1 year" Where was the patient employed at that time?: "[employer]" Has patient ever been in the [employer]?: No  Financial Resources:   Surveyor, quantity resources: Support from parents / caregiver,Medicaid Does patient have a Lawyer or guardian?: No  Alcohol/Substance Abuse:   What has been your use of drugs/alcohol within the last 12 months?: Marijuana: "every 2 days, a couple of blunts" If attempted suicide, did drugs/alcohol play a role in this?: No Alcohol/Substance Abuse Treatment Hx: Denies past history Has alcohol/substance abuse ever caused legal problems?: No  Social Support System:   Patient's Community Support System: Good Describe Community Support System: "my big brother, big sister, grandma" Type of faith/religion: Pt denies. How does patient's faith help to cope with current illness?: Pt denies.  Leisure/Recreation:   Do You Have Hobbies?: Yes Leisure and Hobbies: "playing basketball with my friends"  Strengths/Needs:   What is the patient's perception of their strengths?: "I don't know really.  I'm a leader." Patient  states they can use these personal  strengths during their treatment to contribute to their recovery: Pt denies. Patient states these barriers may affect/interfere with their treatment: Pt denies.  Discharge Plan:   Currently receiving community mental health services: No Patient states concerns and preferences for aftercare planning are: Pt reports that he is open to a referral at this time. Patient states they will know when they are safe and ready for discharge when: "I feel fine now" Does patient have access to transportation?: Yes Does patient have financial barriers related to discharge medications?: No Plan for living situation after discharge: Pt reports that he will be staying with his sister. Will patient be returning to same living situation after discharge?: No  Summary/Recommendations:   Summary and Recommendations (to be completed by the evaluator): Patient is a 20 year old male from [place], [place] [facility]  He reports that he is currently unemployed though has a job lined up after discharge.  He reports that he will be staying with his older sister.  He presents to the hospital following reports that he had locked himself in his room and was found with pill bottles some empty around him.  He reports that he had gotten into an argument with both his mother and brother and went to his room.  He reports that the pill bottles that were found were because he is responsible for giving his little brother his allergy medication and taking his own allergy medication. He has a primary diagnosis of Major Depressive Disorder.  Recommendations for pt include: crisis stabilization, therapeutic milieu, encourage group attendance and participation, medication management for mood stabilization, and development for comprehensive mental wellness plan. CSW assessing for appropriate referrals.  Harden Mo. 08/16/2020

## 2020-08-16 NOTE — Progress Notes (Signed)
Patient alert and oriented x 4, affect is flat but he brightens upon approach, he was noted interacting appropriately with peers in the dayroom, his thoughts are organized and coherent he denies SI/HI/AVH, gave him emotional support and encouragement.15 minutes safety checks maintained will continue to monitor.

## 2020-08-16 NOTE — Plan of Care (Signed)
Pt denies depression, hopelessness, anxiety, SI, HI and AVH. Pt was educated on care plan and verbalizes understanding. Torrie Mayers RN Problem: Activity: Goal: Will identify at least one activity in which they can participate Outcome: Adequate for Discharge   Problem: Coping: Goal: Ability to identify and develop effective coping behavior will improve Outcome: Adequate for Discharge Goal: Ability to interact with others will improve Outcome: Adequate for Discharge Goal: Demonstration of participation in decision-making regarding own care will improve Outcome: Adequate for Discharge Goal: Ability to use eye contact when communicating with others will improve Outcome: Adequate for Discharge   Problem: Health Behavior/Discharge Planning: Goal: Identification of resources available to assist in meeting health care needs will improve Outcome: Adequate for Discharge   Problem: Self-Concept: Goal: Will verbalize positive feelings about self Outcome: Adequate for Discharge

## 2020-08-16 NOTE — B[facility] Suicide Risk Assessment (Signed)
[facility] Discharge Suicide Risk Assessment   Principal Problem: MDD (major depressive disorder), recurrent episode, severe (HCC) Discharge Diagnoses: Principal Problem:   MDD (major depressive disorder), recurrent episode, severe (HCC) Active Problems:   Cannabis use disorder, moderate, dependence (HCC)   Total Time spent with patient: 35 minutes- 25 minutes face-to-face contact with patient, 10 minutes documentation, coordination of care, scripts   Musculoskeletal: Strength & Muscle Tone: within normal limits Gait & Station: normal Patient leans: N/A  Psychiatric Specialty Exam: Review of Systems  Constitutional: Negative for activity change and fatigue.  HENT: Negative for rhinorrhea and sore throat.   Eyes: Negative for photophobia and visual disturbance.  Respiratory: Negative for cough and shortness of breath.   Cardiovascular: Negative for chest pain and palpitations.  Gastrointestinal: Negative for constipation, diarrhea, nausea and vomiting.  Endocrine: Negative for cold intolerance and heat intolerance.  Genitourinary: Negative for difficulty urinating and dysuria.  Musculoskeletal: Negative for arthralgias and myalgias.  Skin: Negative for rash and wound.  Allergic/Immunologic: Negative for environmental allergies and food allergies.  Neurological: Negative for dizziness and headaches.  Hematological: Negative for adenopathy. Does not bruise/bleed easily.  Psychiatric/Behavioral: Negative for agitation, behavioral problems, hallucinations and suicidal ideas.    Blood pressure 133/80, pulse 68, temperature 98 F (36.7 C), temperature source Oral, resp. rate 18, height 5\' 11"  (1.803 m), weight 104.3 kg, SpO2 100 %.Body mass index is 32.08 kg/m.  General Appearance: Fairly Groomed  ::  Fair  Speech:  Clear and Coherent and Normal Rate  Volume:  Normal  Mood:  Euthymic  Affect:  Congruent  Thought Process:  Coherent and Linear  Orientation:  Full (Time, Place,  and Person)  Thought Content:  Logical  Suicidal Thoughts:  No  Homicidal Thoughts:  No  Memory:  Immediate;   Fair Recent;   Fair Remote;   Fair  Judgement:  Intact  Insight:  Fair  Psychomotor Activity:  Normal  Concentration:  Fair  Recall:  [phone] of Knowledge:Fair  Language: Fair  Akathisia:  Negative  Handed:  Right  AIMS (if indicated):     Assets:  Communication Skills Desire for Improvement Housing Leisure Time Physical Health Social Support Talents/Skills  Sleep:  Number of Hours: 7.3  Cognition: WNL  ADL's:  Intact   Mental Status Per Nursing Assessment::   On Admission:  Suicidal ideation indicated by others  Demographic Factors:  Male and Adolescent or young adult  Loss Factors: NA  Historical Factors: Impulsivity  Risk Reduction Factors:   Sense of responsibility to family, Living with another person, especially a relative, Positive social support, Positive therapeutic relationship and Positive coping skills or problem solving skills  Continued Clinical Symptoms:  Depression:   Recent sense of peace/wellbeing Previous Psychiatric Diagnoses and Treatments  Cognitive Features That Contribute To Risk:  None    Suicide Risk:  Minimal: No identifiable suicidal ideation.  Patients presenting with no risk factors but with morbid ruminations; may be classified as minimal risk based on the severity of the depressive symptoms    Plan Of Care/Follow-up recommendations:  Activity:  as tolerated Diet:  regular diet  [phone], MD 08/16/2020, 10:44 AM

## 2020-08-16 NOTE — BHH Group Notes (Signed)
[facility] Group Notes:  (Nursing/MHT/Case Management/Adjunct)  Date:  08/16/2020  Time:  6:21 AM  Type of Therapy:  wrap up  Participation Level:  Active  Participation Quality:  Appropriate  Affect:  Appropriate  Cognitive:  Appropriate  Insight:  Appropriate  Engagement in Group:  Engaged  Modes of Intervention:  Clarification  Summary of Progress/Problems:  Landry Mellow 08/16/2020, 6:21 AM

## 2020-08-16 NOTE — Progress Notes (Signed)
Pt denies SI, HI and AVH. Pt was educated on dc plan and AVS and verbalized understanding. Pt was given belongings and dc packet. Torrie Mayers RN

## 2020-08-21 ENCOUNTER — Other Ambulatory Visit: Payer: Self-pay

## 2020-08-21 NOTE — Patient Outreach (Signed)
Care Coordination  08/21/2020  GLYNN YEPES 04/25/2001 [id number]   Medicaid Managed Care   Unsuccessful Outreach Note  08/21/2020 Name: Miguel Smith MRN: [id number] DOB: 06/23/2000  Referred by: [facility] Reason for referral : High Risk Managed Medicaid (MM Social Work Unsuccessful [employer])   An unsuccessful telephone outreach was attempted today. The patient was referred to the case management team for assistance with care management and care coordination.   Follow Up Plan: The care management team will reach out to the patient again over the next 7 days.   Gus Puma, BSW, [facility]  [place]  High Risk Managed Medicaid Team

## 2020-08-21 NOTE — Patient Instructions (Signed)
Visit Information  Mr. Miguel Smith  - as a part of your Medicaid benefit, you are eligible for care management and care coordination services at no cost or copay. I was unable to reach you by phone today but would be happy to help you with your health related needs. Please feel free to call me @ [phone].   A member of the Managed Medicaid care management team will reach out to you again over the next 7 days.   Gus Puma, BSW, [facility]  [place]  High Risk Managed Medicaid Team

## 2020-12-30 ENCOUNTER — Encounter: Payer: Self-pay | Admitting: *Deleted

## 2020-12-30 ENCOUNTER — Emergency Department: Payer: Medicaid Other

## 2020-12-30 ENCOUNTER — Other Ambulatory Visit: Payer: Self-pay

## 2020-12-30 ENCOUNTER — Emergency Department
Admission: EM | Admit: 2020-12-30 | Discharge: 2020-12-30 | Disposition: A | Discharge status: Home / Self Care | Payer: Medicaid Other | Attending: Emergency Medicine | Admitting: Emergency Medicine

## 2020-12-30 DIAGNOSIS — S0101XA Laceration without foreign body of scalp, initial encounter: Secondary | ICD-10-CM

## 2020-12-30 DIAGNOSIS — Y92094 Garage of other non-institutional residence as the place of occurrence of the external cause: Secondary | ICD-10-CM | POA: Diagnosis not present

## 2020-12-30 DIAGNOSIS — W01198A Fall on same level from slipping, tripping and stumbling with subsequent striking against other object, initial encounter: Secondary | ICD-10-CM | POA: Insufficient documentation

## 2020-12-30 DIAGNOSIS — S0990XA Unspecified injury of head, initial encounter: Secondary | ICD-10-CM

## 2020-12-30 DIAGNOSIS — Y9302 Activity, running: Secondary | ICD-10-CM | POA: Insufficient documentation

## 2020-12-30 DIAGNOSIS — Z043 Encounter for examination and observation following other accident: Secondary | ICD-10-CM | POA: Diagnosis not present

## 2020-12-30 DIAGNOSIS — M542 Cervicalgia: Secondary | ICD-10-CM | POA: Diagnosis not present

## 2020-12-30 DIAGNOSIS — R220 Localized swelling, mass and lump, head: Secondary | ICD-10-CM | POA: Diagnosis not present

## 2020-12-30 MED ORDER — ACETAMINOPHEN 325 MG PO TABS
650.0000 mg | ORAL_TABLET | Freq: Once | ORAL | Status: AC
  Administered 2020-12-30: 650 mg via ORAL
  Filled 2020-12-30: qty 2

## 2020-12-30 MED ORDER — LIDOCAINE-EPINEPHRINE-TETRACAINE (LET) SOLUTION
3.0000 mL | Freq: Once | NASAL | Status: AC
  Administered 2020-12-30: 3 mL via TOPICAL
  Filled 2020-12-30 (×2): qty 3

## 2020-12-30 NOTE — ED Triage Notes (Signed)
Pt slipped and fell in the garage today and fell backwards striking head on cement.  Pt has a laceration to back of head.  No loc  no vomiting.  Pt has neck pain.  Pt alert speech clear.  Denies back pain.

## 2020-12-30 NOTE — Discharge Instructions (Addendum)
See your pediatrician in 7 to 10 days for staple removal.

## 2020-12-30 NOTE — ED Provider Notes (Signed)
[facility] Emergency Department Provider Note ____________________________________________  Time seen: 2114  I have reviewed the triage vital signs and the nursing notes.  HISTORY  Chief Complaint  Fall   HPI Miguel Smith is a 20 y.o. male presents to the ED accompanied by his mother, for evaluation of an injury sustained after he slipped and fell in the garage.  Patient reports running into the garage during the rainy down for, when he apparently slipped while wearing his Crocs.  He lost his footing, and landed hitting the back of his head and neck.  He presents with a laceration to the posterior scalp, as well as some neck pain.  Patient does admit to some dizziness, blurry vision, and ringing in his ears following the incident.  He denies any frank LOC, vision change, or subsequent nausea.  History reviewed. No pertinent past medical history.  Patient Active Problem List   Diagnosis Date Noted   MDD (major depressive disorder), recurrent episode, severe (HCC) 08/14/2020   Cannabis use disorder, moderate, dependence (HCC) 08/14/2020    History reviewed. No pertinent surgical history.  Prior to Admission medications   Not on File    Allergies Patient has no known allergies.  History reviewed. No pertinent family history.  Social History Social History   Tobacco Use   Smoking status: Never   Smokeless tobacco: Never  Substance Use Topics   Alcohol use: No   Drug use: Yes    Types: Marijuana    Review of Systems  Constitutional: Negative for fever. Eyes: Negative for visual changes. ENT: Negative for sore throat. Cardiovascular: Negative for chest pain. Respiratory: Negative for shortness of breath. Gastrointestinal: Negative for abdominal pain, vomiting and diarrhea. Genitourinary: Negative for dysuria. Musculoskeletal: Negative for back pain.  Reports neck pain as above. Skin: Negative for rash.  Reports scalp laceration as  above. Neurological: Negative for headaches, focal weakness or numbness. ____________________________________________  PHYSICAL EXAM:  VITAL SIGNS: ED Triage Vitals  Enc Vitals Group     BP 12/30/20 1926 129/75     Pulse Rate 12/30/20 1925 (!) 54     Resp 12/30/20 1925 18     Temp 12/30/20 1925 98.6 F (37 C)     Temp Source 12/30/20 1925 Oral     SpO2 12/30/20 1925 100 %     Weight 12/30/20 1926 210 lb (95.3 kg)     Height 12/30/20 1926 6' (1.829 m)     Head Circumference --      Peak Flow --      Pain Score 12/30/20 1926 10     Pain Loc --      Pain Edu? --      Excl. in GC? --     Constitutional: Alert and oriented. Well appearing and in no distress. Head: Normocephalic and atraumatic, except for posterior scalp laceration measuring approximately 4 cm. Eyes: Conjunctivae are normal. PERRL. Normal extraocular movements Ears: Canals clear. TMs intact bilaterally. Neck: Supple. No thyromegaly. Hematological/Lymphatic/Immunological: No cervical lymphadenopathy. Cardiovascular: Normal rate, regular rhythm. Normal distal pulses. Respiratory: Normal respiratory effort. No wheezes/rales/rhonchi. Gastrointestinal: Soft and nontender. No distention. Musculoskeletal: Nontender with normal range of motion in all extremities.  Neurologic: Cranial nerves II to XII grossly intact.  Negative pronator drift.  Normal finger-nose exam.  Normal tandem walk.  Normal gait without ataxia. Normal speech and language. No gross focal neurologic deficits are appreciated. Skin:  Skin is warm, dry and intact. No rash noted. Psychiatric: Mood and affect are  normal. Patient exhibits appropriate insight and judgment. ____________________________________________   RADIOLOGY Official radiology report(s): CT Head Wo Contrast  Result Date: 12/30/2020 CLINICAL DATA:  Status post fall. EXAM: CT HEAD WITHOUT CONTRAST TECHNIQUE: Contiguous axial images were obtained from the base of the skull through the  vertex without intravenous contrast. COMPARISON:  None. FINDINGS: Brain: No evidence of acute infarction, hemorrhage, hydrocephalus, extra-axial collection or mass lesion/mass effect. Vascular: No hyperdense vessel or unexpected calcification. Skull: Normal. Negative for fracture or focal lesion. Sinuses/Orbits: No acute finding. Other: Mild left posterior parietal scalp soft tissue swelling is seen. IMPRESSION: Mild left posterior parietal scalp soft tissue swelling without acute fracture or acute intracranial abnormality. Electronically Signed   By: Aram Candela M.D.   On: 12/30/2020 22:46   CT Cervical Spine Wo Contrast  Result Date: 12/30/2020 CLINICAL DATA:  Status post fall. EXAM: CT CERVICAL SPINE WITHOUT CONTRAST TECHNIQUE: Multidetector CT imaging of the cervical spine was performed without intravenous contrast. Multiplanar CT image reconstructions were also generated. COMPARISON:  None. FINDINGS: Alignment: Normal. Skull base and vertebrae: No acute fracture. No primary bone lesion or focal pathologic process. Soft tissues and spinal canal: No prevertebral fluid or swelling. No visible canal hematoma. Disc levels: Normal multilevel endplates are seen with normal multilevel intervertebral disc spaces. Normal bilateral multilevel facet joints are noted. Upper chest: Negative. Other: None. IMPRESSION: Normal cervical spine CT. Electronically Signed   By: Aram Candela M.D.   On: 12/30/2020 22:48   ____________________________________________  PROCEDURES  .Marland KitchenLaceration Repair  Date/Time: 12/30/2020 10:01 PM Performed by: Lissa Hoard, PA-C Authorized by: Lissa Hoard, PA-C   Consent:    Consent obtained:  Verbal   Consent given by:  Patient   Risks discussed:  Pain Universal protocol:    Procedure explained and questions answered to patient or proxy's satisfaction: yes     Imaging studies available: yes     Site/side marked: yes     Patient identity  confirmed:  Verbally with patient Anesthesia:    Anesthesia method:  Topical application   Topical anesthetic:  LET Laceration details:    Location:  Scalp   Scalp location:  Occipital   Length (cm):  4   Depth (mm):  3 Pre-procedure details:    Preparation:  Patient was prepped and draped in usual sterile fashion Exploration:    Limited defect created (wound extended): no     Hemostasis achieved with:  LET   Contaminated: no   Treatment:    Area cleansed with:  Saline   Amount of cleaning:  Standard   Irrigation solution:  Sterile saline   Debridement:  None   Undermining:  None   Scar revision: no   Skin repair:    Repair method:  Staples   Number of staples:  5 Approximation:    Approximation:  Close Repair type:    Repair type:  Simple Post-procedure details:    Dressing:  Open (no dressing)   Procedure completion:  Tolerated well, no immediate complications ____________________________________________   INITIAL IMPRESSION / ASSESSMENT AND PLAN / ED COURSE  As part of my medical decision making, I reviewed the following data within the electronic MEDICAL RECORD NUMBER Radiograph reviewed WNL and Notes from prior ED visits   Pediatric patient ED evaluation of injury sustained following mechanical fall.  Patient slipped on wet floor, landing on the back of his head and neck.  He presents with a large scab to the occiput and some complaints of  some mild concussive syndromes.  His exam is overall benign reassuring at this time.,  And his CT of the head and neck are negative for any acute findings.  He is discharged after staple repair of a scalp laceration to follow-up with his pediatrician for staple removal in 7 to 10 days.   Miguel Smith was evaluated in Emergency Department on 12/30/2020 for the symptoms described in the history of present illness. He was evaluated in the context of the global COVID-19 pandemic, which necessitated consideration that the patient might  be at risk for infection with the SARS-CoV-2 virus that causes COVID-19. Institutional protocols and algorithms that pertain to the evaluation of patients at risk for COVID-19 are in a state of rapid change based on information released by regulatory bodies including the CDC and federal and state organizations. These policies and algorithms were followed during the patient's care in the ED. ____________________________________________  FINAL CLINICAL IMPRESSION(S) / ED DIAGNOSES  Final diagnoses:  Laceration of scalp, initial encounter  Minor head injury, initial encounter      Lissa Hoard, PA-C 12/30/20 2329    Phineas Semen, MD 12/31/20 [phone]

## 2020-12-31 ENCOUNTER — Telehealth: Payer: Self-pay

## 2020-12-31 DIAGNOSIS — S0101XD Laceration without foreign body of scalp, subsequent encounter: Secondary | ICD-10-CM | POA: Diagnosis not present

## 2020-12-31 NOTE — Telephone Encounter (Signed)
Transition Care Management Follow-up Telephone Call Date of discharge and from where: 12/30/2020-[facility] How have you been since you were released from the hospital? Patient stated he is feeling a little bette.  Any questions or concerns? No  Items Reviewed: Did the pt receive and understand the discharge instructions provided? Yes  Medications obtained and verified?  No medication given at discharge.  Other? No  Any new allergies since your discharge? No  Dietary orders reviewed? N/A Do you have support at home? Yes   Home Care and Equipment/Supplies: Were home health services ordered? not applicable If so, what is the name of the agency? N/A  Has the agency set up a time to come to the patient's home? not applicable Were any new equipment or medical supplies ordered?  No What is the name of the medical supply agency? N/A Were you able to get the supplies/equipment? not applicable Do you have any questions related to the use of the equipment or supplies? No  Functional Questionnaire: (I = Independent and D = Dependent) ADLs: I  Bathing/Dressing- I  Meal Prep- I  Eating- I  Maintaining continence- I  Transferring/Ambulation- I  Managing Meds- I  Follow up appointments reviewed:  PCP Hospital f/u appt confirmed? No  patient will call today to schedule follow up with PCP. Specialist Hospital f/u appt confirmed? No   Are transportation arrangements needed? No  If their condition worsens, is the pt aware to call PCP or go to the Emergency Dept.? Yes Was the patient provided with contact information for the PCP's office or ED? Yes Was to pt encouraged to call back with questions or concerns? Yes

## 2021-01-10 DIAGNOSIS — S0101XD Laceration without foreign body of scalp, subsequent encounter: Secondary | ICD-10-CM | POA: Diagnosis not present

## 2021-01-10 DIAGNOSIS — Z4802 Encounter for removal of sutures: Secondary | ICD-10-CM | POA: Diagnosis not present

## 2021-02-13 ENCOUNTER — Telehealth: Payer: Self-pay

## 2021-02-13 NOTE — Patient Outreach (Signed)
Care Coordination  02/13/2021  TEVAN MARIAN Aug 23, 2000 [id number]   Medicaid Managed Care   Unsuccessful Outreach Note  02/13/2021 Name: MOUSSA WIEGAND MRN: [id number] DOB: 15-May-2001  Referred by: [facility] Reason for referral : High Risk Managed Medicaid (MM Social Work Unsuccessful Outreach)   A second unsuccessful telephone outreach was attempted today. The patient was referred to the case management team for assistance with care management and care coordination.   Follow Up Plan: The care management team will reach out to the patient again over the next 7 days.   Gus Puma, BSW, [place] [facility]  [employer] Risk Managed Medicaid Team  [phone]

## 2021-02-13 NOTE — Patient Instructions (Signed)
Visit Information  Mr. Miguel Smith  - as a part of your Medicaid benefit, you are eligible for care management and care coordination services at no cost or copay. I was unable to reach you by phone today but would be happy to help you with your health related needs. Please feel free to call me @ [phone].   A member of the Managed Medicaid care management team will reach out to you again over the next 7 days.   Gus Puma, BSW, [place] [facility]  [employer] Risk Managed Medicaid Team  [phone]

## 2021-02-24 ENCOUNTER — Other Ambulatory Visit: Payer: Self-pay

## 2021-02-24 NOTE — Patient Instructions (Signed)
Visit Information  Mr. Miguel Smith  - as a part of your Medicaid benefit, you are eligible for care management and care coordination services at no cost or copay. I was unable to reach you by phone today but would be happy to help you with your health related needs. Please feel free to call me @ [phone].     Gus Puma, BSW, [place] [facility]  [employer] Risk Managed Medicaid Team  [phone]

## 2021-02-24 NOTE — Patient Outreach (Signed)
Care Coordination  02/24/2021  Miguel Smith 13-Jun-2000 [id number]   Medicaid Managed Care   Unsuccessful Outreach Note  02/24/2021 Name: Miguel Smith MRN: [id number] DOB: 2000-07-08  Referred by: [facility] Reason for referral : High Risk Managed Medicaid (MM Social Work Unsuccessful [employer])   Third unsuccessful telephone outreach was attempted today. The patient was referred to the case management team for assistance with care management and care coordination. The patient's primary care provider has been notified of our unsuccessful attempts to make or maintain contact with the patient. The care management team is pleased to engage with this patient at any time in the future should he/she be interested in assistance from the care management team.   Follow Up Plan: No further follow up required: unable to contact patient  Gus Puma, BSW, [facility]  [facility]  High Risk Managed Medicaid Team  [phone]

## 2022-09-28 IMAGING — CT CT CERVICAL SPINE W/O CM
3 of 4 series · 13 of 33 positions shown, 16 images · non-contrast
Comparison: None.

CLINICAL DATA: Status post fall.

EXAM:
CT CERVICAL SPINE WITHOUT CONTRAST
TECHNIQUE: Multidetector CT imaging of the cervical spine was performed without
intravenous contrast. Multiplanar CT image reconstructions were also
generated.

[Series 6: orthogonal bone · axial · 0.23mm/px · z∈[-308,-180]mm · 5 of 102 slices shown, 7 images]
[im 17/102  soft-tissue]
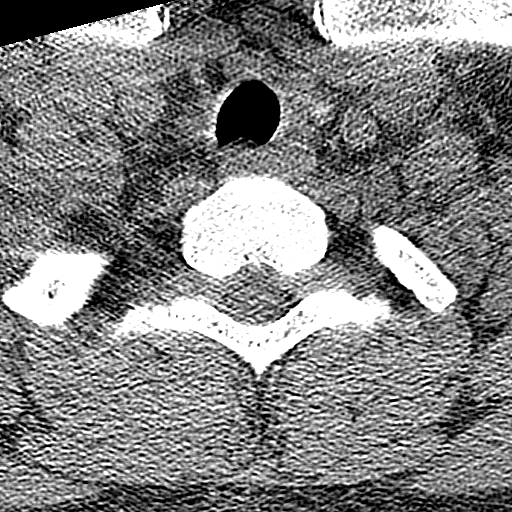
[im 17/102  bone]
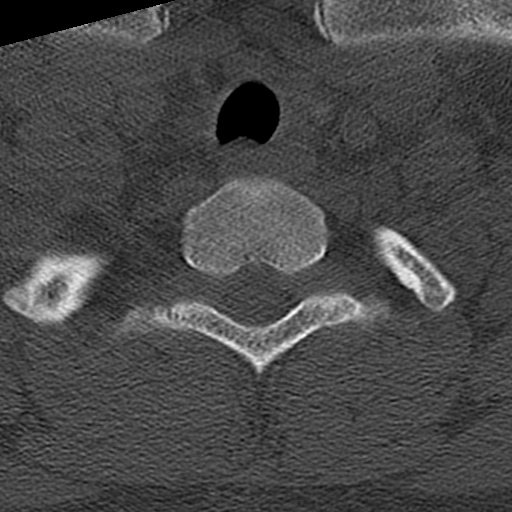
[im 34/102  bone]
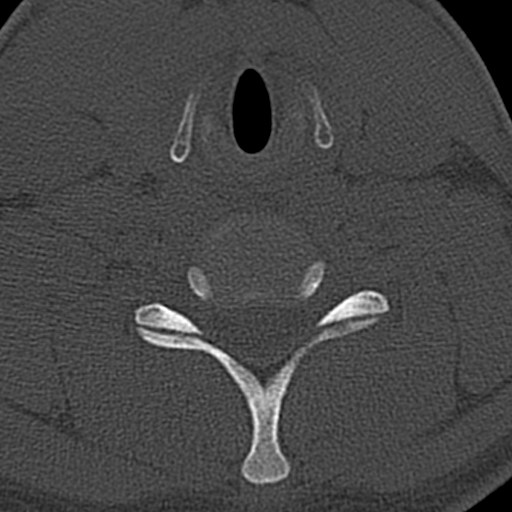
[im 51/102  bone]
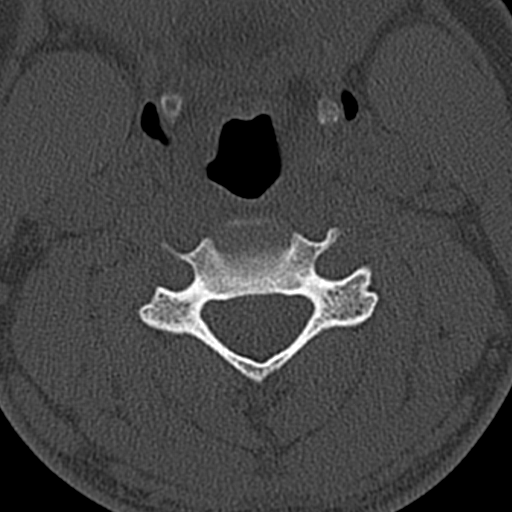
[im 68/102  bone]
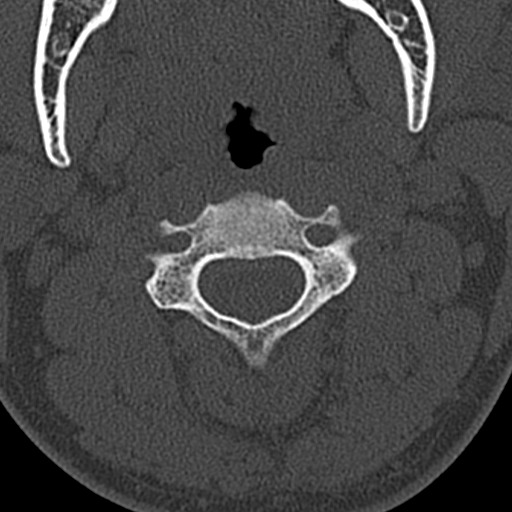
[im 85/102  soft-tissue]
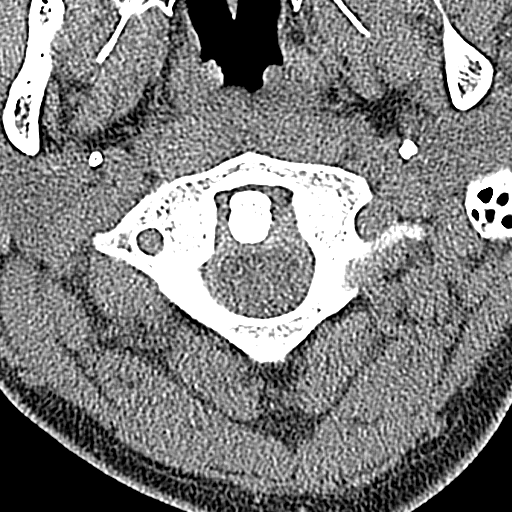
[im 85/102  bone]
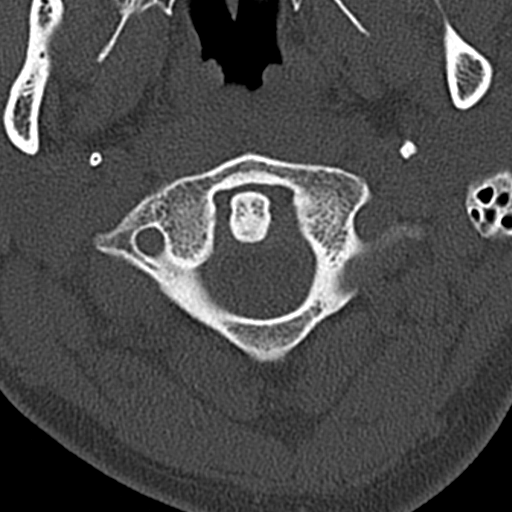

[Series 7: sagittal bone · sagittal · 0.28mm/px · 5 of 61 slices shown, 6 images]
[im 21/61  bone]
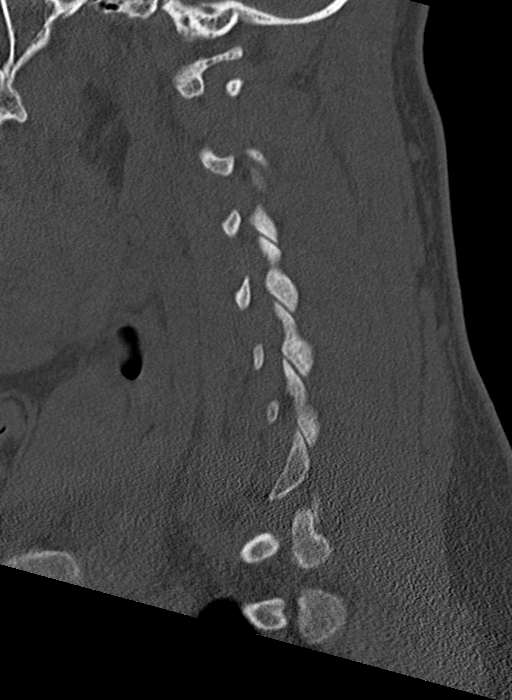
[im 26/61  bone]
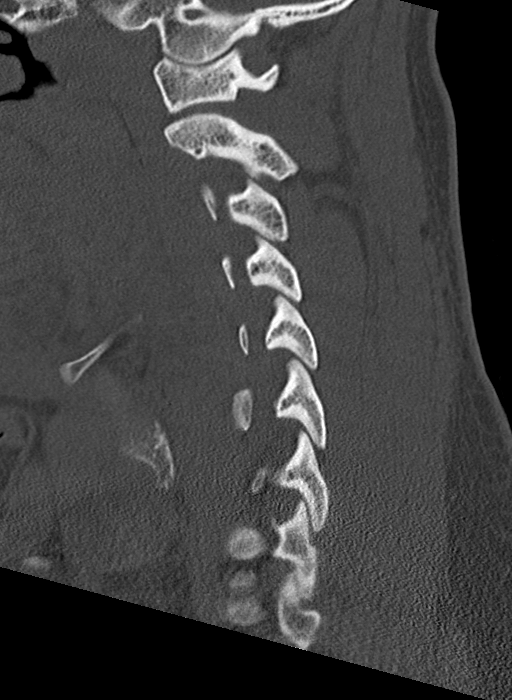
[im 31/61  soft-tissue]
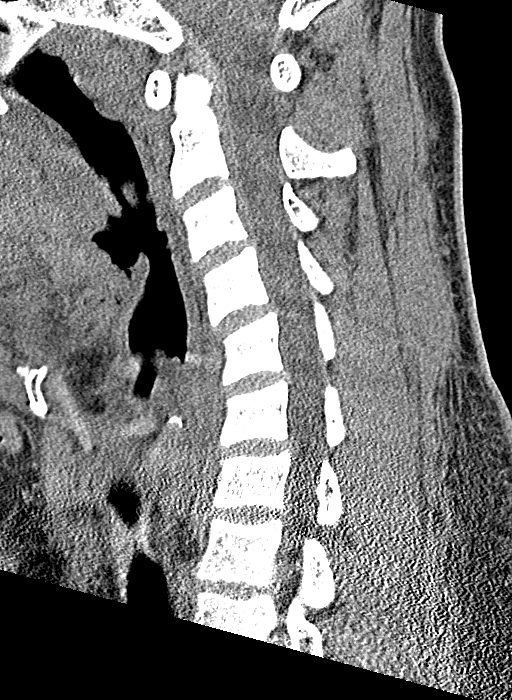
[im 31/61  bone]
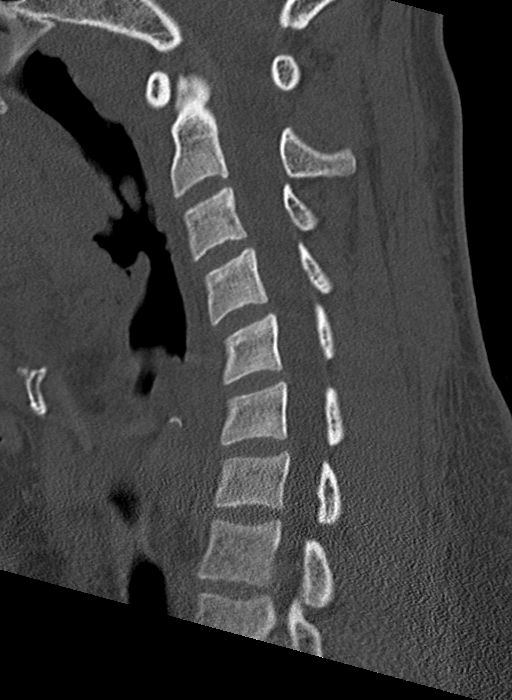
[im 36/61  bone]
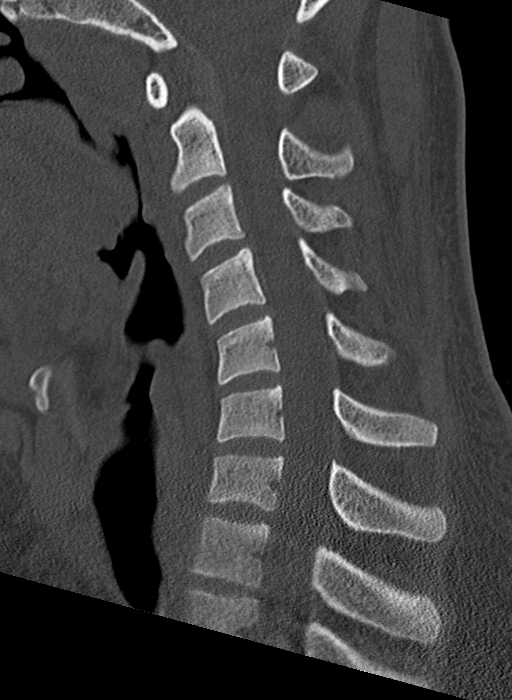
[im 41/61  bone]
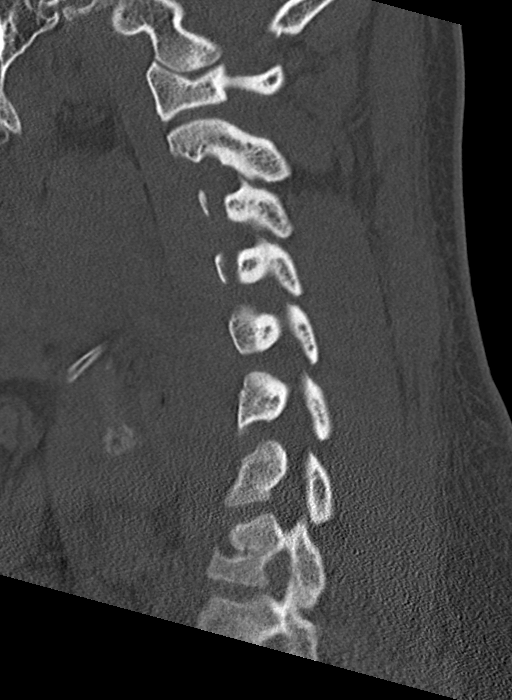

[Series 8: coronal bone · coronal · 0.27mm/px · 3 of 77 slices shown]
[im 16/77  bone]
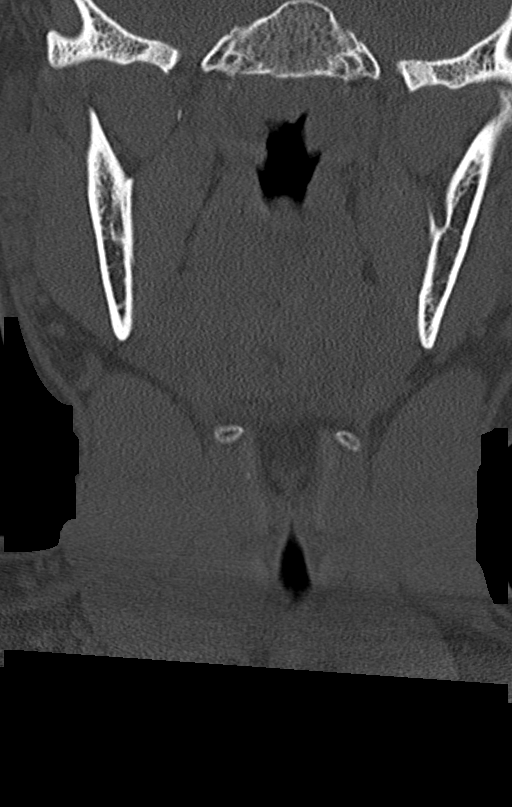
[im 31/77  bone]
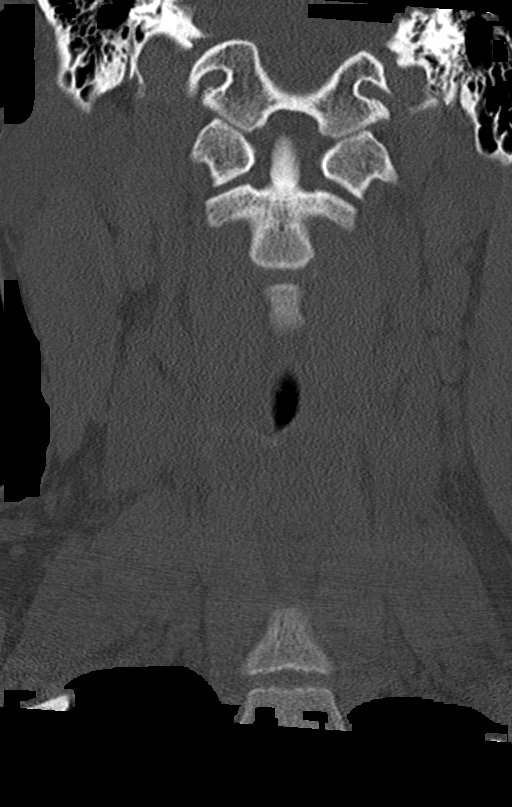
[im 46/77  bone]
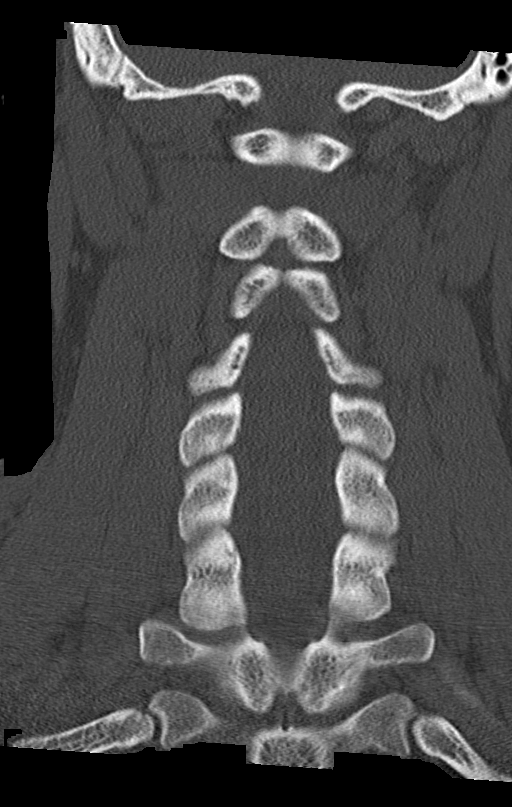

[13 of 33 positions shown; findings below may reference images not displayed]

FINDINGS: Alignment: Normal.

Skull base and vertebrae: No acute fracture. No primary bone lesion
or focal pathologic process.

Soft tissues and spinal canal: No prevertebral fluid or swelling. No
visible canal hematoma.

Disc levels: Normal multilevel endplates are seen with normal
multilevel intervertebral disc spaces.

Normal bilateral multilevel facet joints are noted.

Upper chest: Negative.

Other: None.
IMPRESSION: Normal cervical spine CT.

## 2022-09-28 IMAGING — CT CT HEAD W/O CM
3 series · 15 of 47 positions shown, 18 images · non-contrast
Comparison: None.

CLINICAL DATA: Status post fall.

EXAM:
CT HEAD WITHOUT CONTRAST
TECHNIQUE: Contiguous axial images were obtained from the base of the skull
through the vertex without intravenous contrast.

[Series 2: head wo · axial · 0.47mm/px · z∈[-157,-17]mm · 9 of 34 slices shown, 12 images]
[im 3/34  brain]
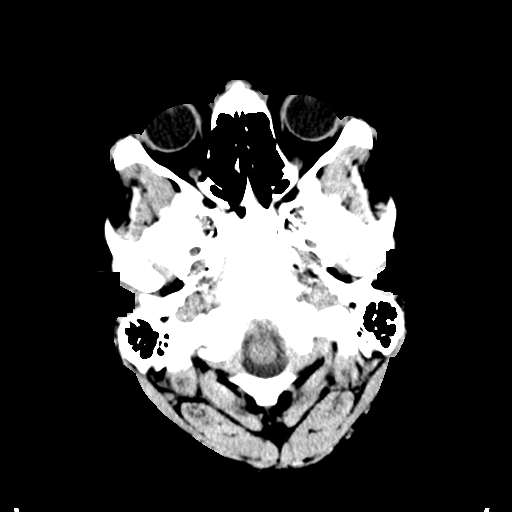
[im 3/34  bone]
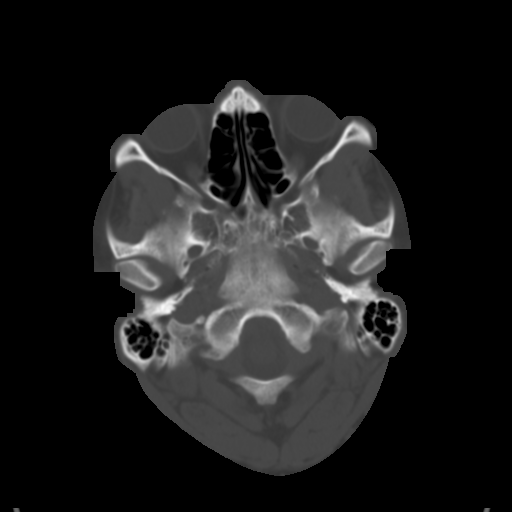
[im 6/34  brain]
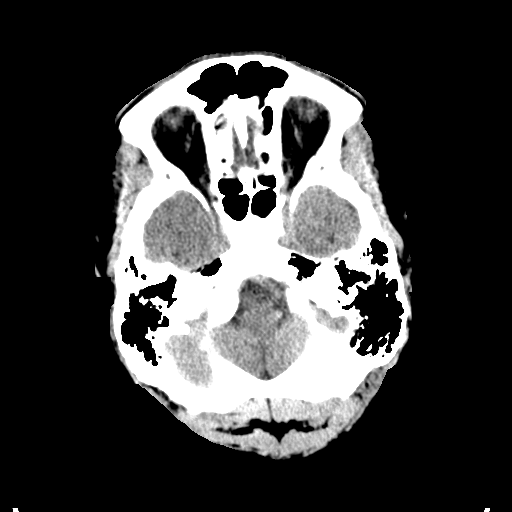
[im 10/34  brain]
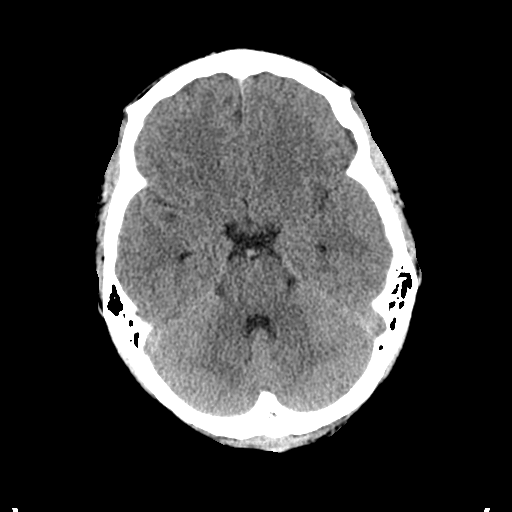
[im 13/34  brain]
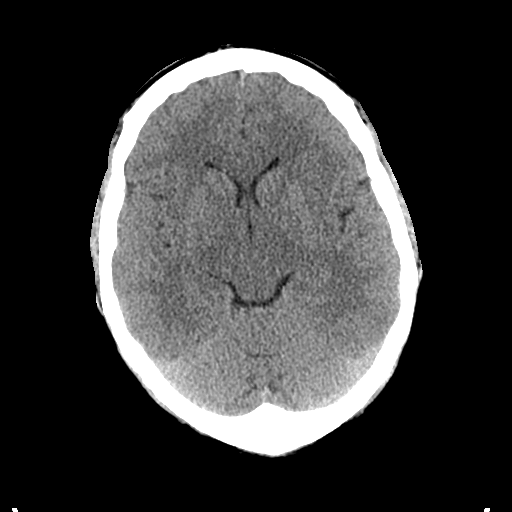
[im 18/34  brain]
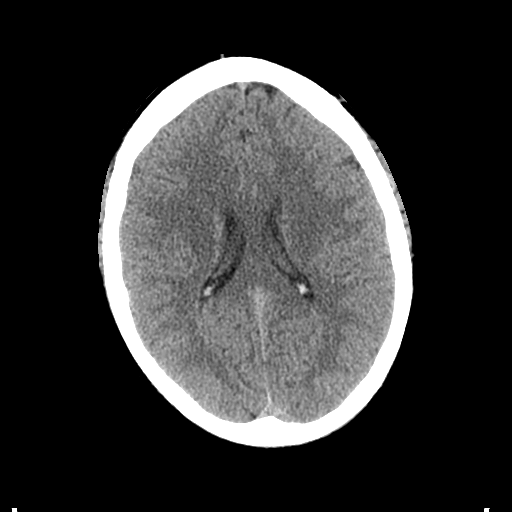
[im 18/34  bone]
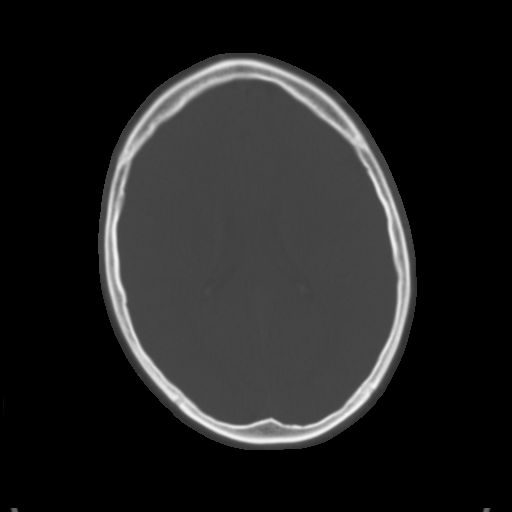
[im 21/34  brain]
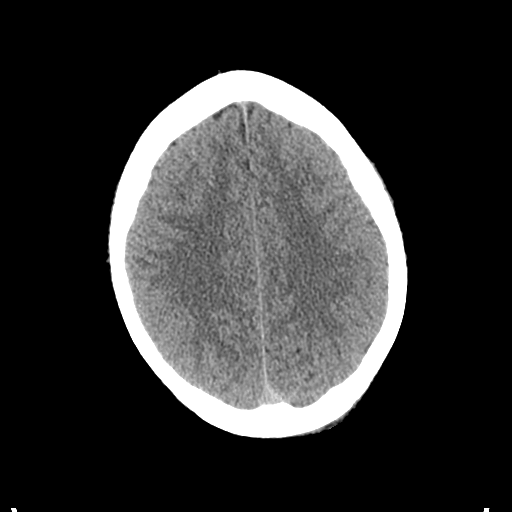
[im 24/34  brain]
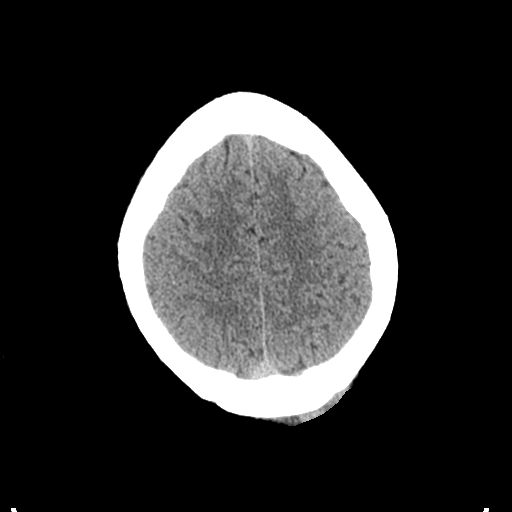
[im 28/34  brain]
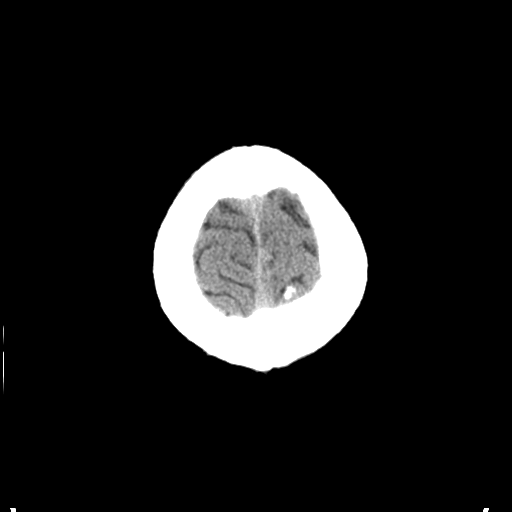
[im 31/34  brain]
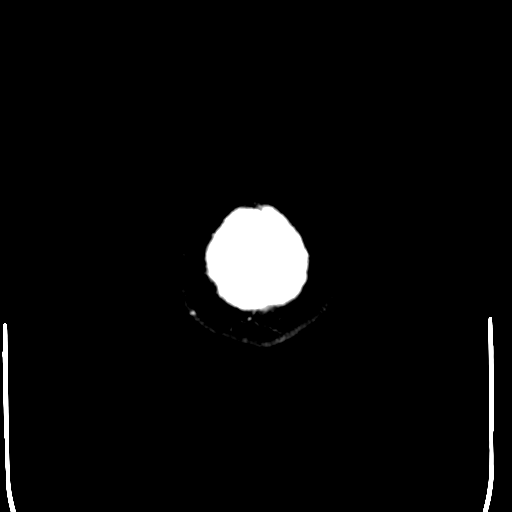
[im 31/34  bone]
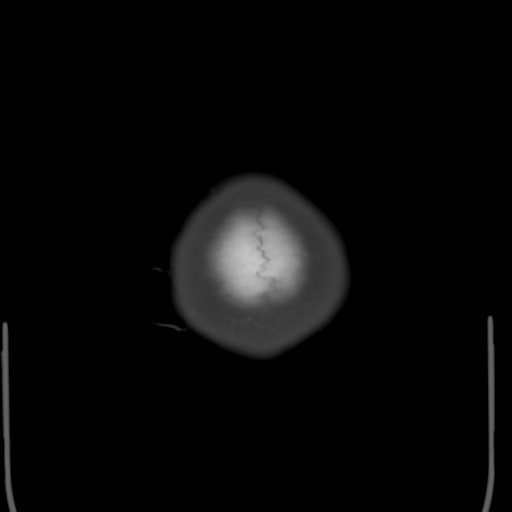

[Series 4: coronal soft tissue · coronal · 0.36mm/px · 3 of 78 slices shown]
[im 26/78  brain]
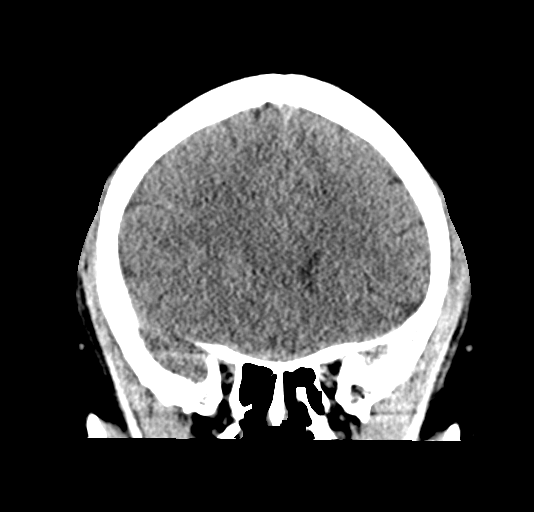
[im 35/78  brain]
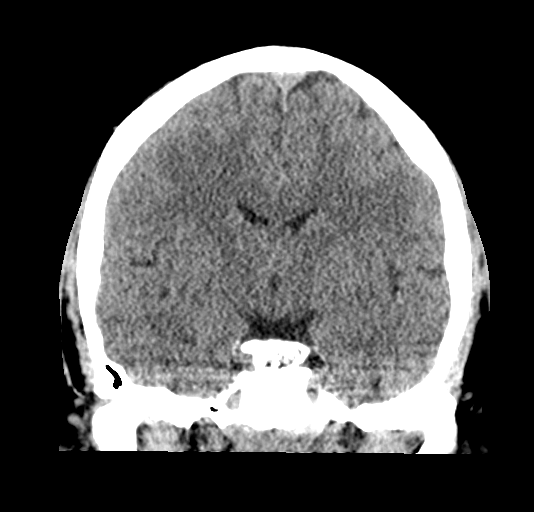
[im 43/78  brain]
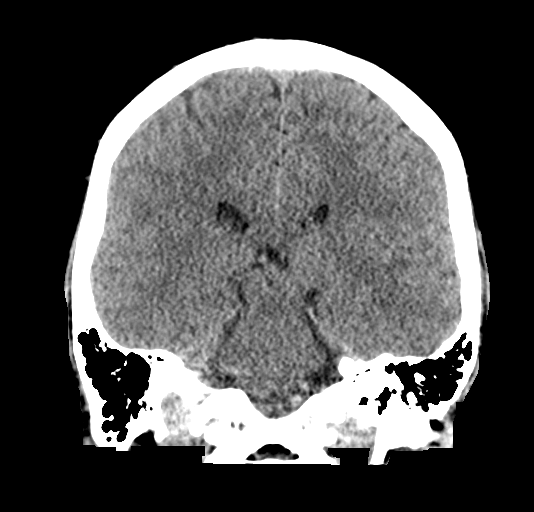

[Series 5: sagittal soft tissue · sagittal · 0.34mm/px · 3 of 63 slices shown]
[im 21/63  brain]
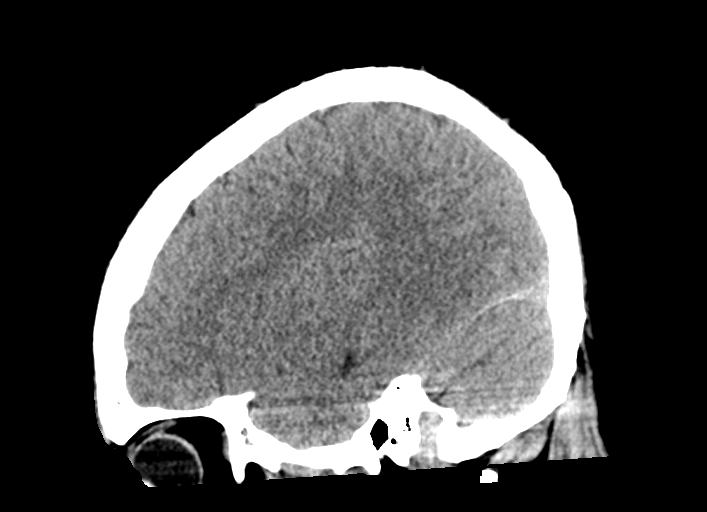
[im 32/63  brain]
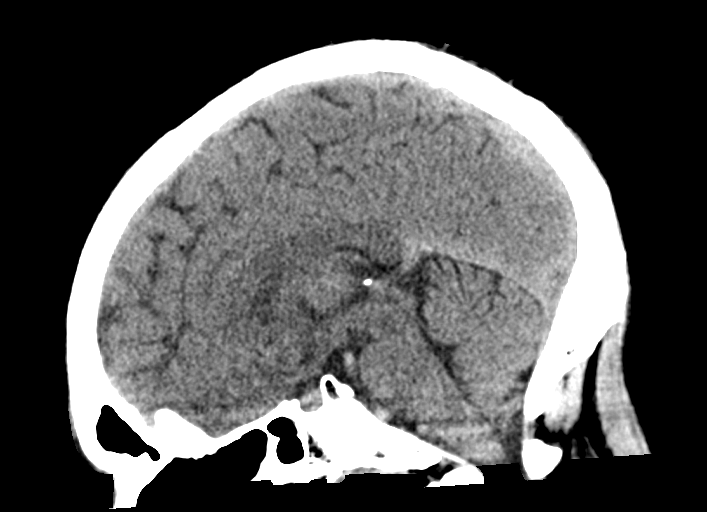
[im 42/63  brain]
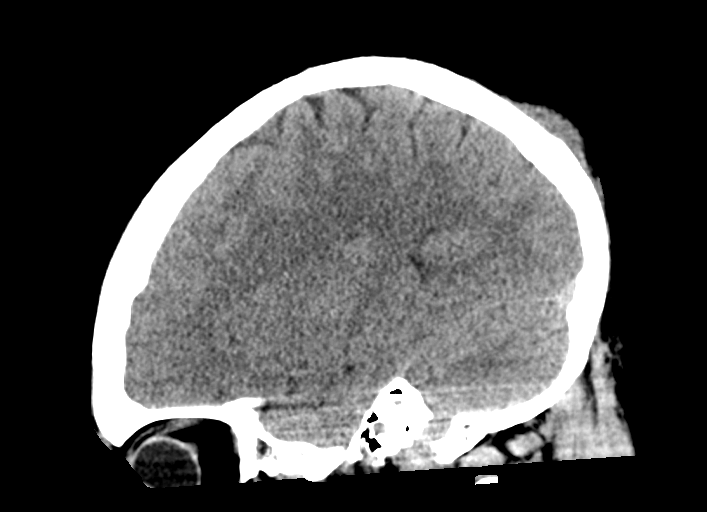

[15 of 47 positions shown; findings below may reference images not displayed]

FINDINGS: Brain: No evidence of acute infarction, hemorrhage, hydrocephalus,
extra-axial collection or mass lesion/mass effect.

Vascular: No hyperdense vessel or unexpected calcification.

Skull: Normal. Negative for fracture or focal lesion.

Sinuses/Orbits: No acute finding.

Other: Mild left posterior parietal scalp soft tissue swelling is
seen.
IMPRESSION: Mild left posterior parietal scalp soft tissue swelling without
acute fracture or acute intracranial abnormality.

## 2023-05-07 ENCOUNTER — Emergency Department: Payer: Medicaid Other

## 2023-05-07 ENCOUNTER — Emergency Department
Admission: EM | Admit: 2023-05-07 | Discharge: 2023-05-07 | Disposition: A | Discharge status: Home / Self Care | Payer: Medicaid Other | Attending: Emergency Medicine | Admitting: Emergency Medicine

## 2023-05-07 ENCOUNTER — Other Ambulatory Visit: Payer: Self-pay

## 2023-05-07 DIAGNOSIS — R509 Fever, unspecified: Secondary | ICD-10-CM | POA: Diagnosis not present

## 2023-05-07 DIAGNOSIS — J168 Pneumonia due to other specified infectious organisms: Secondary | ICD-10-CM | POA: Insufficient documentation

## 2023-05-07 DIAGNOSIS — J181 Lobar pneumonia, unspecified organism: Secondary | ICD-10-CM | POA: Insufficient documentation

## 2023-05-07 DIAGNOSIS — Z20822 Contact with and (suspected) exposure to covid-19: Secondary | ICD-10-CM | POA: Diagnosis not present

## 2023-05-07 DIAGNOSIS — J189 Pneumonia, unspecified organism: Secondary | ICD-10-CM

## 2023-05-07 DIAGNOSIS — R918 Other nonspecific abnormal finding of lung field: Secondary | ICD-10-CM | POA: Diagnosis not present

## 2023-05-07 DIAGNOSIS — R0602 Shortness of breath: Secondary | ICD-10-CM | POA: Diagnosis not present

## 2023-05-07 LAB — COMPREHENSIVE METABOLIC PANEL
ALT: 59 U/L — ABNORMAL HIGH (ref 0–44)
AST: 30 U/L (ref 15–41)
Albumin: 3.8 g/dL (ref 3.5–5.0)
Alkaline Phosphatase: 92 U/L (ref 38–126)
Anion gap: 12 (ref 5–15)
BUN: 12 mg/dL (ref 6–20)
CO2: 20 mmol/L — ABNORMAL LOW (ref 22–32)
Calcium: 9.1 mg/dL (ref 8.9–10.3)
Chloride: 103 mmol/L (ref 98–111)
Creatinine, Ser: 1.2 mg/dL (ref 0.61–1.24)
GFR, Estimated: >60 mL/min (ref >60)
Glucose, Bld: 111 mg/dL — ABNORMAL HIGH (ref 70–99)
Potassium: 3.8 mmol/L (ref 3.5–5.1)
Sodium: 135 mmol/L (ref 135–145)
Total Bilirubin: 1.1 mg/dL (ref <1.2)
Total Protein: 7.7 g/dL (ref 6.5–8.1)

## 2023-05-07 LAB — CBC
HCT: 42.2 % (ref 39.0–52.0)
Hemoglobin: 13.7 g/dL (ref 13.0–17.0)
MCH: 27.3 pg (ref 26.0–34.0)
MCHC: 32.5 g/dL (ref 30.0–36.0)
MCV: 84.1 fL (ref 80.0–100.0)
Platelets: 210 10*3/uL (ref 150–400)
RBC: 5.02 MIL/uL (ref 4.22–5.81)
RDW: 12.1 % (ref 11.5–15.5)
WBC: 7.5 10*3/uL (ref 4.0–10.5)
nRBC: 0 % (ref 0.0–0.2)

## 2023-05-07 LAB — RESP PANEL BY RT-PCR (RSV, FLU A&B, COVID)  RVPGX2
Influenza A by PCR: NEGATIVE
Influenza B by PCR: NEGATIVE
Resp Syncytial Virus by PCR: NEGATIVE
SARS Coronavirus 2 by RT PCR: NEGATIVE

## 2023-05-07 MED ORDER — SODIUM CHLORIDE 0.9 % IV SOLN
2.0000 g | Freq: Once | INTRAVENOUS | Status: AC
  Administered 2023-05-07: 2 g via INTRAVENOUS
  Filled 2023-05-07: qty 20

## 2023-05-07 MED ORDER — KETOROLAC TROMETHAMINE 30 MG/ML IJ SOLN
30.0000 mg | Freq: Once | INTRAMUSCULAR | Status: AC
  Administered 2023-05-07: 30 mg via INTRAVENOUS
  Filled 2023-05-07: qty 1

## 2023-05-07 MED ORDER — ALBUTEROL SULFATE HFA 108 (90 BASE) MCG/ACT IN AERS: 2.0000 | INHALATION_SPRAY | Freq: Four times a day (QID) | RESPIRATORY_TRACT | 2 refills | Status: DC | PRN

## 2023-05-07 MED ORDER — IPRATROPIUM-ALBUTEROL 0.5-2.5 (3) MG/3ML IN SOLN
3.0000 mL | Freq: Once | RESPIRATORY_TRACT | Status: AC
  Administered 2023-05-07: 3 mL via RESPIRATORY_TRACT
  Filled 2023-05-07: qty 3

## 2023-05-07 MED ORDER — AMOXICILLIN 500 MG PO CAPS: 1000.0000 mg | ORAL_CAPSULE | Freq: Three times a day (TID) | ORAL | 0 refills | Status: AC

## 2023-05-07 MED ORDER — SODIUM CHLORIDE 0.9 % IV SOLN
500.0000 mg | Freq: Once | INTRAVENOUS | Status: AC
  Administered 2023-05-07: 500 mg via INTRAVENOUS
  Filled 2023-05-07: qty 5

## 2023-05-07 MED ORDER — SODIUM CHLORIDE 0.9 % IV BOLUS
500.0000 mL | Freq: Once | INTRAVENOUS | Status: AC
  Administered 2023-05-07: 500 mL via INTRAVENOUS

## 2023-05-07 MED ORDER — ACETAMINOPHEN 500 MG PO TABS
1000.0000 mg | ORAL_TABLET | Freq: Once | ORAL | Status: AC
  Administered 2023-05-07: 1000 mg via ORAL
  Filled 2023-05-07: qty 2

## 2023-05-07 MED ORDER — AZITHROMYCIN 250 MG PO TABS: ORAL_TABLET | ORAL | 0 refills | Status: AC

## 2023-05-07 NOTE — ED Triage Notes (Signed)
Pt presents to ER with c/o sob, fever, and body aches x3 days.  Pt has hx of asthma.  Unknown if pt has been around anyone sick.  Pt able to speak in complete sentences on arrival.  Pt does appear to be sweating on arrival.  Pt otherwise A&O x4 and in NAD.

## 2023-05-07 NOTE — ED Provider Notes (Signed)
[facility] Provider Note    Event Date/Time   First MD Initiated Contact with Patient 05/07/23 [phone]     (approximate)   History   Fever and Shortness of Breath   HPI  Miguel Smith is a 22 y.o. male with a history of asthma who presents with complaints of fever, shortness of breath.  Describes sore throat, body aches, cough as well.  Symptoms started last 2 days.     Physical Exam   Triage Vital Signs: ED Triage Vitals  Encounter Vitals Group     BP 05/07/23 0657 99/80     Systolic BP Percentile --      Diastolic BP Percentile --      Pulse Rate 05/07/23 0657 (!) 128     Resp 05/07/23 0657 18     Temp 05/07/23 0657 (!) 103.2 F (39.6 C)     Temp Source 05/07/23 0657 Oral     SpO2 05/07/23 0657 94 %     Weight 05/07/23 0654 104.3 kg (230 lb)     Height 05/07/23 0654 1.829 m (6')     Head Circumference --      Peak Flow --      Pain Score 05/07/23 0654 4     Pain Loc --      Pain Education --      Exclude from Growth Chart --     Most recent vital signs: Vitals:   05/07/23 0900 05/07/23 0930  BP: 116/69 116/70  Pulse: 98 94  Resp: 16 18  Temp: 99.2 F (37.3 C)   SpO2: 97% 96%     General: Awake, no distress.  CV:  Good peripheral perfusion.  Resp:  Normal effort.  No significant wheezing Abd:  No distention.  Other:     ED Results / Procedures / Treatments   Labs (all labs ordered are listed, but only abnormal results are displayed) Labs Reviewed  COMPREHENSIVE METABOLIC PANEL - Abnormal; Notable for the following components:      Result Value   CO2 20 (*)    Glucose, Bld 111 (*)    ALT 59 (*)    All other components within normal limits  RESP PANEL BY RT-PCR (RSV, FLU A&B, COVID)  RVPGX2  CBC     EKG  ED ECG REPORT I, Jene Every, the attending physician, personally viewed and interpreted this ECG.  Date: 05/07/2023  Rhythm: Sinus tachycardia QRS Axis: normal Intervals: normal ST/T Wave  abnormalities: normal Narrative Interpretation: no evidence of acute ischemia    RADIOLOGY Chest x-ray viewed interpret by me, left-sided pneumonia noted    PROCEDURES:  Critical Care performed:   Procedures   MEDICATIONS ORDERED IN ED: Medications  acetaminophen (TYLENOL) tablet 1,000 mg (1,000 mg Oral Given 05/07/23 0701)  ipratropium-albuterol (DUONEB) 0.5-2.5 (3) MG/3ML nebulizer solution 3 mL (3 mLs Nebulization Given 05/07/23 0734)  sodium chloride 0.9 % bolus 500 mL (0 mLs Intravenous Stopped 05/07/23 0826)  ketorolac (TORADOL) 30 MG/ML injection 30 mg (30 mg Intravenous Given 05/07/23 0733)  cefTRIAXone (ROCEPHIN) 2 g in sodium chloride 0.9 % 100 mL IVPB (0 g Intravenous Stopped 05/07/23 0855)  azithromycin (ZITHROMAX) 500 mg in sodium chloride 0.9 % 250 mL IVPB (0 mg Intravenous Stopped 05/07/23 1009)     IMPRESSION / MDM / ASSESSMENT AND PLAN / ED COURSE  I reviewed the triage vital signs and the nursing notes. Patient's presentation is most consistent with acute presentation with potential threat to life  or bodily function.  Patient presents with fever, tachycardia, shortness of breath.  O2 saturation 94% on room air however no significant tachypnea.  Blood pressure is lower than typical for him although not hypotensive.  Differential includes pneumonia, viral illness including influenza, COVID  Will obtain labs, give IV fluids, obtain chest x-ray, COVID flu PCR, will give a DuoNeb given history of asthma  Chest x-ray is consistent with pneumonia per my read, will give a dose of IV Rocephin, IV azithromycin in addition to IV fluids and IV Toradol and reevaluate  COVID flu PCR negative, blood reviewed and is reassuring.  Patient feeling much better after treatment, no indication for admission at this time, appropriate for outpatient treatment with amoxicillin, azithromycin, strict return precautions, Ventolin inhaler provided, work note provided, patient and mother  agree with this plan      FINAL CLINICAL IMPRESSION(S) / ED DIAGNOSES   Final diagnoses:  Pneumonia of left lower lobe due to infectious organism     Rx / DC Orders   ED Discharge Orders          Ordered    amoxicillin (AMOXIL) 500 MG capsule  3 times daily        05/07/23 0929    azithromycin (ZITHROMAX Z-PAK) 250 MG tablet        05/07/23 0929    albuterol (VENTOLIN HFA) 108 (90 Base) MCG/ACT inhaler  Every 6 hours PRN        05/07/23 0929             Note:  This document was prepared using Dragon voice recognition software and may include unintentional dictation errors.   Jene Every, MD 05/07/23 1049

## 2023-06-10 DIAGNOSIS — R112 Nausea with vomiting, unspecified: Secondary | ICD-10-CM | POA: Diagnosis not present

## 2023-06-10 DIAGNOSIS — K529 Noninfective gastroenteritis and colitis, unspecified: Secondary | ICD-10-CM | POA: Diagnosis not present

## 2023-06-24 ENCOUNTER — Emergency Department: Payer: Medicaid Other

## 2023-06-24 ENCOUNTER — Encounter: Payer: Self-pay | Admitting: Emergency Medicine

## 2023-06-24 ENCOUNTER — Other Ambulatory Visit: Payer: Self-pay

## 2023-06-24 DIAGNOSIS — S9031XA Contusion of right foot, initial encounter: Secondary | ICD-10-CM | POA: Diagnosis not present

## 2023-06-24 DIAGNOSIS — S99921A Unspecified injury of right foot, initial encounter: Secondary | ICD-10-CM | POA: Diagnosis not present

## 2023-06-24 DIAGNOSIS — W208XXA Other cause of strike by thrown, projected or falling object, initial encounter: Secondary | ICD-10-CM | POA: Diagnosis not present

## 2023-06-24 DIAGNOSIS — Y990 Civilian activity done for income or pay: Secondary | ICD-10-CM | POA: Insufficient documentation

## 2023-06-24 DIAGNOSIS — Y9259 Other trade areas as the place of occurrence of the external cause: Secondary | ICD-10-CM | POA: Insufficient documentation

## 2023-06-24 NOTE — ED Triage Notes (Signed)
Patient ambulatory to triage with steady gait, without difficulty or distress noted; pt reports that he dropped a box on his rt foot tonight at work; c/o pain since; denies desire to file workers comp at this time

## 2023-06-25 ENCOUNTER — Emergency Department: Payer: Medicaid Other

## 2023-06-25 ENCOUNTER — Emergency Department
Admission: EM | Admit: 2023-06-25 | Discharge: 2023-06-25 | Disposition: A | Discharge status: Home / Self Care | Payer: Medicaid Other | Attending: Emergency Medicine | Admitting: Emergency Medicine

## 2023-06-25 DIAGNOSIS — S99921A Unspecified injury of right foot, initial encounter: Secondary | ICD-10-CM | POA: Diagnosis not present

## 2023-06-25 DIAGNOSIS — S9031XA Contusion of right foot, initial encounter: Secondary | ICD-10-CM

## 2023-06-25 MED ORDER — ACETAMINOPHEN 500 MG PO TABS
1000.0000 mg | ORAL_TABLET | Freq: Once | ORAL | Status: AC
Start: 2023-06-25 — End: 2023-06-25
  Administered 2023-06-25: 1000 mg via ORAL
  Filled 2023-06-25: qty 2

## 2023-06-25 MED ORDER — KETOROLAC TROMETHAMINE 30 MG/ML IJ SOLN
30.0000 mg | Freq: Once | INTRAMUSCULAR | Status: AC
  Administered 2023-06-25: 30 mg via INTRAMUSCULAR
  Filled 2023-06-25: qty 1

## 2023-06-25 NOTE — ED Provider Notes (Signed)
[facility] Provider Note    Event Date/Time   First MD Initiated Contact with Patient 06/25/23 0401     (approximate)   History   Foot Injury   HPI  Miguel Smith is a 23 y.o. male   Past medical history of no significant past medical history presents emergency department with a right foot injury.  He works at a [employer] systems and a large box fell onto his right foot.  No other injuries noted.  Has been ambulatory despite the pain.  Independent Historian contributed to assessment above: His mother is here with him who corroborates information past medical history as above     Physical Exam   Triage Vital Signs: ED Triage Vitals  Encounter Vitals Group     BP 06/24/23 2355 (!) 125/95     Systolic BP Percentile --      Diastolic BP Percentile --      Pulse Rate 06/24/23 2355 92     Resp 06/24/23 2355 18     Temp 06/24/23 2355 98.3 F (36.8 C)     Temp Source 06/24/23 2355 Oral     SpO2 06/24/23 2355 99 %     Weight 06/24/23 2351 270 lb (122.5 kg)     Height 06/24/23 2351 6' (1.829 m)     Head Circumference --      Peak Flow --      Pain Score 06/24/23 2351 8     Pain Loc --      Pain Education --      Exclude from Growth Chart --     Most recent vital signs: Vitals:   06/24/23 2355 06/25/23 0421  BP: (!) 125/95 (!) 115/99  Pulse: 92 72  Resp: 18 18  Temp: 98.3 F (36.8 C) 98.4 F (36.9 C)  SpO2: 99% 97%    General: Awake, no distress.  CV:  Good peripheral perfusion.  Resp:  Normal effort. Abd:  No distention.  Other:  Swelling and tenderness to palpation to the dorsal of the right proximal foot.  Able to range the ankle.  Neurovascular intact.   ED Results / Procedures / Treatments   Labs (all labs ordered are listed, but only abnormal results are displayed) Labs Reviewed - No data to display     RADIOLOGY I independently reviewed and interpreted x-ray of the foot and see no obvious fracture or  dislocation I also reviewed radiologist's formal read.   PROCEDURES:  Critical Care performed: No  Procedures   MEDICATIONS ORDERED IN ED: Medications  ketorolac (TORADOL) 30 MG/ML injection 30 mg (30 mg Intramuscular Given 06/25/23 0418)  acetaminophen (TYLENOL) tablet 1,000 mg (1,000 mg Oral Given 06/25/23 0418)      IMPRESSION / MDM / ASSESSMENT AND PLAN / ED COURSE  I reviewed the triage vital signs and the nursing notes.                                Patient's presentation is most consistent with acute presentation with potential threat to life or bodily function.  Differential diagnosis includes, but is not limited to, fracture dislocation of the foot, ankle, neurovascular injury, tendon injury, contusion   MDM:   Obvious swelling and contusion of the right foot with no fracture or dislocation noted on x-ray imaging.  Neurovascular intact.  Anticipatory guidance given, RICE therapy, medications, and an Ace wrap applied.  Discharged with close  PMD follow-up\        FINAL CLINICAL IMPRESSION(S) / ED DIAGNOSES   Final diagnoses:  Contusion of right foot, initial encounter     Rx / DC Orders   ED Discharge Orders     None        Note:  This document was prepared using Dragon voice recognition software and may include unintentional dictation errors.    Pilar Jarvis, MD 06/25/23 [phone]

## 2023-06-25 NOTE — ED Notes (Signed)
Patient provided with discharge instructions via Modesto Charon, MD. ACE wrapped ankle prior to dispo. Patient stable and ambulatory with steady even gait on discharge.

## 2023-06-25 NOTE — Discharge Instructions (Addendum)
Take acetaminophen 650 mg and ibuprofen 400 mg every 6 hours for pain.  Take with food.  Thank you for choosing us for your health care today!  Please see your primary doctor this week for a follow up appointment.   If you have any new, worsening, or unexpected symptoms call your doctor right away or come back to the emergency department for reevaluation.  It was my pleasure to care for you today.   Yarrow Linhart S. Kealan Buchan, MD  

## 2023-11-25 ENCOUNTER — Telehealth: Payer: Self-pay

## 2023-11-25 NOTE — Progress Notes (Unsigned)
 Complex Care Management Note Care Guide Note  11/25/2023 Name: Miguel Smith MRN: [id number] DOB: 04-22-2001   Complex Care Management Outreach Attempts: An unsuccessful telephone outreach was attempted today to offer the patient information about available complex care management services.  Follow Up Plan:  Additional outreach attempts will be made to offer the patient complex care management information and services.   Encounter Outcome:  No Answer  Lenton Rail , RMA     [place]  [facility], [facility] Guide  Direct Dial: [phone]  Website: [email or web address]

## 2023-12-01 NOTE — Progress Notes (Unsigned)
 Complex Care Management Note Care Guide Note  12/01/2023 Name: Miguel Smith MRN: [id number] DOB: 08-05-2000   Complex Care Management Outreach Attempts: A second unsuccessful outreach was attempted today to offer the patient with information about available complex care management services.  Follow Up Plan:  Additional outreach attempts will be made to offer the patient complex care management information and services.   Encounter Outcome:  No Answer  Jeoffrey Buffalo , RMA     [place]  [facility], [facility] Guide  Direct Dial: [phone]  Website: [email or web address]

## 2023-12-02 NOTE — Progress Notes (Signed)
 Complex Care Management Note Care Guide Note  12/02/2023 Name: Miguel Smith MRN: [id number] DOB: 2001-05-02   Complex Care Management Outreach Attempts: A third unsuccessful outreach was attempted today to offer the patient with information about available complex care management services.  Follow Up Plan:  No further outreach attempts will be made at this time. We have been unable to contact the patient to offer or enroll patient in complex care management services.  Encounter Outcome:  No Answer  Jeoffrey Buffalo , RMA     [place]  [facility], [facility] Guide  Direct Dial: [phone]  Website: [email or web address]

## 2024-02-10 ENCOUNTER — Ambulatory Visit
Admission: RE | Admit: 2024-02-10 | Discharge: 2024-02-10 | Disposition: A | Discharge status: Home / Self Care | Payer: Self-pay | Source: Ambulatory Visit | Attending: Emergency Medicine | Admitting: Emergency Medicine

## 2024-02-10 VITALS — BP 126/82 | HR 73 | Temp 98.1°F | Resp 18 | Wt 219.0 lb

## 2024-02-10 DIAGNOSIS — Z758 Other problems related to medical facilities and other health care: Secondary | ICD-10-CM

## 2024-02-10 DIAGNOSIS — M62830 Muscle spasm of back: Secondary | ICD-10-CM

## 2024-02-10 DIAGNOSIS — M5442 Lumbago with sciatica, left side: Secondary | ICD-10-CM

## 2024-02-10 MED ORDER — CYCLOBENZAPRINE HCL 5 MG PO TABS: 5.0000 mg | ORAL_TABLET | Freq: Two times a day (BID) | ORAL | 0 refills | Status: AC | PRN

## 2024-02-10 MED ORDER — METHYLPREDNISOLONE 4 MG PO TBPK: ORAL_TABLET | ORAL | 0 refills | Status: DC

## 2024-02-10 NOTE — Discharge Instructions (Addendum)
 Take home meds as directed.  May use heat or ice to back for comfort 20 min 3 x daily.  May use lidocaine  patch or biofreeze for pain, take flexeril  and steroid as directed.  Please follow up with PCP, may need to referral to physical therapy for further back pain management.  Go immediately to nearest ER or call 9-1-1 for loss of bowel and bladder,loss of function, saddle numbness, etc.  Pt advised otc tylenol  as label directed for pain Avoid lifting,turning,bending as this will aggravate your back

## 2024-02-10 NOTE — ED Triage Notes (Signed)
 Patient states that he's having lower back pain that radiates down his leg. Hx of back pain. Back pain x 2 months

## 2024-02-10 NOTE — ED Provider Notes (Signed)
 [facility]    CSN: [id number] Arrival date & time: 02/10/24  1428      History   Chief Complaint Chief Complaint  Patient presents with   Back Pain    lower back pain , numbness in my left leg and pain when i bend over , sit or stand too long - Entered by patient    HPI Miguel Smith is a 23 y.o. male.   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 Pt reports left sided back pain with radiation down left leg. No primary care provider, no orthopedic evaluation in past. Has had [facility] himself, last year was last appt per pt report.  Pt denies smoking,drinking or drug use.   The history is provided by the patient. No language interpreter was used.    History reviewed. No pertinent past medical history.  Patient Active Problem List   Diagnosis Date Noted   Acute left-sided low back pain with left-sided sciatica 02/10/2024   Muscle spasm of back 02/10/2024   Does not have primary care provider 02/10/2024   MDD (major depressive disorder), recurrent episode, severe (HCC) 08/14/2020   Cannabis use disorder, moderate, dependence (HCC) 08/14/2020    History reviewed. No pertinent surgical history.     Home Medications    Prior to Admission medications   Medication Sig Start Date End Date Taking? Authorizing Provider  cyclobenzaprine  (FLEXERIL ) 5 MG tablet Take 1 tablet (5 mg total) by mouth every 12 (twelve) hours as needed for up to 5 days for muscle spasms. 02/10/24 02/15/24 Yes General Wearing, Rilla, NP  methylPREDNISolone  (MEDROL  DOSEPAK) 4 MG TBPK tablet Take as package directed 02/10/24  Yes Shanya Ferriss, Rilla, NP  albuterol  (VENTOLIN  HFA) 108 (90 Base) MCG/ACT inhaler Inhale 2 puffs into the lungs every 6 (six) hours  as needed for wheezing or shortness of breath. 05/07/23   Miguel Charleston, MD    Family History History reviewed. No pertinent family history.  Social History Social History   Tobacco Use   Smoking status: Never   Smokeless tobacco: Never  Substance Use Topics   Alcohol use: No   Drug use: Yes    Types: Marijuana     Allergies   Patient has no known allergies.   Review of Systems Review of Systems  Constitutional:  Negative for fever.  Genitourinary:  Negative for dysuria.  Musculoskeletal:  Positive for back pain and myalgias.  Neurological:  Negative for weakness.  All other systems reviewed and are negative.    Physical Exam Triage Vital Signs ED Triage Vitals  Encounter Vitals Group     BP      Girls Systolic BP Percentile      Girls Diastolic BP Percentile      Boys Systolic BP Percentile      Boys Diastolic BP Percentile      Pulse      Resp      Temp      Temp src      SpO2      Weight      Height      Head Circumference      Peak Flow      Pain Score      Pain Loc  Pain Education      Exclude from Growth Chart    No data found.  Updated Vital Signs BP 126/82 (BP Location: Left Arm)   Pulse 73   Temp 98.1 F (36.7 C) (Oral)   Resp 18   Wt 219 lb (99.3 kg)   SpO2 99%   BMI 29.70 kg/m   Visual Acuity Right Eye Distance:   Left Eye Distance:   Bilateral Distance:    Right Eye Near:   Left Eye Near:    Bilateral Near:     Physical Exam Vitals and nursing note reviewed.  Constitutional:      Appearance: He is well-developed and well-groomed.  HENT:     Head: Normocephalic.  Cardiovascular:     Rate and Rhythm: Normal rate and regular rhythm.     Heart sounds: Normal heart sounds.  Pulmonary:     Effort: Pulmonary effort is normal.     Breath sounds: Normal breath sounds and air entry.  Musculoskeletal:     Lumbar back: Spasms and tenderness present. No swelling, edema, deformity, signs of trauma, lacerations or bony  tenderness. Normal range of motion. Positive left straight leg raise test. Negative right straight leg raise test. No scoliosis.  Neurological:     General: No focal deficit present.     Mental Status: He is alert and oriented to person, place, and time.     GCS: GCS eye subscore is 4. GCS verbal subscore is 5. GCS motor subscore is 6.     Cranial Nerves: No cranial nerve deficit.     Sensory: Sensation is intact.     Motor: Motor function is intact.     Coordination: Coordination is intact.     Gait: Gait is intact.     Comments: Dorsiflex/extension =bilaterally, no foot drop  Psychiatric:        Attention and Perception: Attention normal.        Mood and Affect: Mood normal.        Speech: Speech normal.        Behavior: Behavior is cooperative.      UC Treatments / Results  Labs (all labs ordered are listed, but only abnormal results are displayed) Labs Reviewed - No data to display  EKG   Radiology No results found.  Procedures Procedures (including critical care time)  Medications Ordered in UC Medications - No data to display  Initial Impression / Assessment and Plan / UC Course  I have reviewed the triage vital signs and the nursing notes.  Pertinent labs & imaging results that were available during my care of the patient were reviewed by me and considered in my medical decision making (see chart for details).    Discussed exam findings and plan of care with patient, avoid lifting/turning,twisting, avoid weight lifting, flexeril  and medrol  pk scripted, strict go to ER precautions given.   Patient verbalized understanding to this provider.  Iik:Jrluz left sided low back pain with left sided sciatica, muscle spasm of back, does not have primary care provider Final Clinical Impressions(s) / UC Diagnoses   Final diagnoses:  Acute left-sided low back pain with left-sided sciatica  Muscle spasm of back  Does not have primary care provider     Discharge  Instructions      Take home meds as directed.  May use heat or ice to back for comfort 20 min 3 x daily.  May use lidocaine  patch or biofreeze for pain, take flexeril  and steroid as directed.  Please follow up with PCP, may need to referral to physical therapy for further back pain management.  Go immediately to nearest ER or call 9-1-1 for loss of bowel and bladder,loss of function, saddle numbness, etc.  Pt advised otc tylenol  as label directed for pain Avoid lifting,turning,bending as this will aggravate your back     ED Prescriptions     Medication Sig Dispense Auth. Provider   cyclobenzaprine  (FLEXERIL ) 5 MG tablet Take 1 tablet (5 mg total) by mouth every 12 (twelve) hours as needed for up to 5 days for muscle spasms. 10 tablet Delorse Shane, NP   methylPREDNISolone  (MEDROL  DOSEPAK) 4 MG TBPK tablet Take as package directed 21 tablet Aziah Brostrom, Rilla, NP      PDMP not reviewed this encounter.   Aminta Rilla, NP 02/10/24 2030

## 2024-02-17 ENCOUNTER — Ambulatory Visit (INDEPENDENT_AMBULATORY_CARE_PROVIDER_SITE_OTHER): Admitting: Family Medicine

## 2024-02-17 ENCOUNTER — Encounter: Payer: Self-pay | Admitting: Family Medicine

## 2024-02-17 VITALS — BP 115/75 | HR 79 | Resp 16 | Ht 73.0 in | Wt 219.0 lb

## 2024-02-17 DIAGNOSIS — M62830 Muscle spasm of back: Secondary | ICD-10-CM

## 2024-02-17 DIAGNOSIS — M5442 Lumbago with sciatica, left side: Secondary | ICD-10-CM | POA: Diagnosis not present

## 2024-02-17 DIAGNOSIS — Z7689 Persons encountering health services in other specified circumstances: Secondary | ICD-10-CM | POA: Diagnosis not present

## 2024-02-17 NOTE — Progress Notes (Signed)
 New Patient Office Visit  Subjective   Patient ID: Miguel Smith, male    DOB: August 04, 2000  Age: 23 y.o. MRN: [id number]  CC:  Chief Complaint  Patient presents with   Back Pain    Been hurting a few months when he sits long or when he moves certain ways. Radiates from lower back down the leg and buttocks.    Discussed the use of AI scribe software for clinical note transcription with the patient, who gave verbal consent to proceed.  History of Present Illness   Miguel Smith is a 23 year old male who presents to establish with [facility]. He has concerns today regarding chronic left-sided back pain and sciatica.  He has been experiencing left-sided back pain radiating to the buttock and leg since graduating high school. The pain has worsened over the past two months, described as shooting and aching, particularly exacerbated by prolonged sitting or standing, such as during video gaming or work activities. The pain intensity can reach 8 to 9 out of 10, necessitating breaks from work. Relief is achieved by lying down, while standing or sitting for extended periods aggravates the pain.  He has not experienced urinary incontinence, unexplained weight loss, or trauma. There is no history of significant injuries from playing football in high school. He visited urgent care a week ago and was prescribed a muscle relaxer and a steroid, but has not yet picked up the medication. He works in a physically demanding job Physicist, medical batteries, which involves twisting and lifting, further aggravating his symptoms. No neck pain, weakness, or dragging of the leg. He experiences numbness and tingling on the left side when sitting or standing for long periods. He saw urgent care on 02/10/2024 for these same symptoms.     Outpatient Encounter Medications as of 02/17/2024  Medication Sig   albuterol  (VENTOLIN  HFA) 108 (90 Base) MCG/ACT inhaler Inhale 2 puffs into  the lungs every 6 (six) hours as needed for wheezing or shortness of breath.   methylPREDNISolone  (MEDROL  DOSEPAK) 4 MG TBPK tablet Take as package directed   No facility-administered encounter medications on file as of 02/17/2024.    Patient Active Problem List   Diagnosis Date Noted   Acute left-sided low back pain with left-sided sciatica 02/10/2024   Muscle spasm of back 02/10/2024   Does not have primary care provider 02/10/2024   MDD (major depressive disorder), recurrent episode, severe (HCC) 08/14/2020   Cannabis use disorder, moderate, dependence (HCC) 08/14/2020   No past medical history on file. No past surgical history on file. No family history on file. Social History   Socioeconomic History   Marital status: Single    Spouse name: Not on file   Number of children: Not on file   Years of education: Not on file   Highest education level: Not on file  Occupational History   Not on file  Tobacco Use   Smoking status: Never   Smokeless tobacco: Never  Substance and Sexual Activity   Alcohol use: No   Drug use: Yes    Types: Marijuana   Sexual activity: Not on file  Other Topics Concern   Not on file  Social History Narrative   Not on file   Social Drivers of Health   Financial Resource Strain: Not on file  Food Insecurity: Not on file  Transportation Needs: Not on file  Physical Activity: Not on file  Stress: Not on file  Social Connections: Not on file  Intimate Partner Violence: Not on file   Outpatient Medications Prior to Visit  Medication Sig Dispense Refill   albuterol  (VENTOLIN  HFA) 108 (90 Base) MCG/ACT inhaler Inhale 2 puffs into the lungs every 6 (six) hours as needed for wheezing or shortness of breath. 8 g 2   methylPREDNISolone  (MEDROL  DOSEPAK) 4 MG TBPK tablet Take as package directed 21 tablet 0   No facility-administered medications prior to visit.   No Known Allergies  ROS: see HPI   Objective   Today's Vitals   02/17/24 1100   BP: 115/75  Pulse: 79  Resp: 16  SpO2: 97%  Weight: 219 lb (99.3 kg)  Height: 6' 1 (1.854 m)  PainSc: 0-No pain   Physical Exam Vitals reviewed.  Constitutional:      Appearance: Normal appearance.  Cardiovascular:     Rate and Rhythm: Normal rate and regular rhythm.     Pulses: Normal pulses.     Heart sounds: Normal heart sounds.  Pulmonary:     Effort: Pulmonary effort is normal.     Breath sounds: Normal breath sounds.  Musculoskeletal:     Cervical back: Normal.     Thoracic back: Normal.     Lumbar back: Positive left straight leg raise test (with paraspinous tenderness to palpation). Negative right straight leg raise test.  Neurological:     Mental Status: He is alert.  Psychiatric:        Mood and Affect: Mood normal.        Behavior: Behavior normal.    Assessment & Plan:   1. Encounter to establish care (Primary) Patient is a 23- year-old male who presents today to establish care with primary care at [facility]. Reviewed the past medical history, family history, social history, surgical history, medications and allergies today- updates made as indicated. Patient has concerns today about low back pain with occasional left sided pain/numbness/tingling sensation.   2. Acute left-sided low back pain with left-sided sciatica Chronic left-sided low back pain with sciatica. Exacerbation with radiating pain and numbness. Likely sciatic nerve irritation or inflammation. No red flags such as incontinence or weight loss. Patient was seen at [facility] on 02/10/2024 for these same symptoms and he was prescribed a muscle relaxer and steroid for nerve inflammation. Advised patient to pick-up these prescriptions and to resend prescriptions if needed. Consider x-ray if symptoms persist post-medication. Discuss potential physical therapy referral if pain persists. Instructed to report worsening symptoms like fever or chills.     3. Muscle spasm of back See #2  Return in about 4 weeks  (around 03/16/2024) for back pain .   [name], FNP

## 2024-02-17 NOTE — Patient Instructions (Signed)
 MyChart:  For all urgent or time sensitive needs we ask that you please call the office to avoid delays. Our number is [phone]) [phone]. MyChart is not constantly monitored and due to the large volume of messages a day, replies may take up to 72 business hours.   MyChart Policy: MyChart allows for you to see your visit notes, after visit summary, provider recommendations, lab and tests results, make an appointment, request refills, and contact your provider or the office for non-urgent questions or concerns. Providers are seeing patients during normal business hours and do not have built in time to review MyChart messages.  We ask that you allow a minimum of 3 business days for responses to [employer]. For this reason, please do not send urgent requests through MyChart. Please call the office at [phone]. New and ongoing conditions may require a visit. We have virtual and in person visit available for your convenience.  Complex MyChart concerns may require a visit. Your provider may request you schedule a virtual or in person visit to ensure we are providing the best care possible. MyChart messages sent after 11:00 AM on Friday will not be received by the provider until Monday morning.    Lab and Test Results: You will receive your lab and test results on MyChart as soon as they are completed and results have been sent by the lab or testing facility. Due to this service, you will receive your results BEFORE your provider.  I review lab and tests results each morning prior to seeing patients. Some results require collaboration with other providers to ensure you are receiving the most appropriate care. For this reason, we ask that you please allow a minimum of 3-5 business days from the time the ALL results have been received for your provider to receive and review lab and test results and contact you about these.  Most lab and test result comments from the provider will be sent through  MyChart. Your provider may recommend changes to the plan of care, follow-up visits, repeat testing, ask questions, or request an office visit to discuss these results. You may reply directly to this message or call the office at [phone] to provide information for the provider or set up an appointment. In some instances, you will be called with test results and recommendations. Please let us  know if this is preferred and we will make note of this in your chart to provide this for you.    If you have not heard a response to your lab or test results in 5 business days from all results returning to MyChart, please call the office to let us  know. We ask that you please avoid calling prior to this time unless there is an emergent concern. Due to high call volumes, this can delay the resulting process.   After Hours: For all non-emergency after hours needs, please call the office at [phone] and select the option to reach the on-call provider service. On-call services are shared between multiple [place] offices and therefore it will not be possible to speak directly with your provider. On-call providers may provide medical advice and recommendations, but are unable to provide refills for maintenance medications.  For all emergency or urgent medical needs after normal business hours, we recommend that you seek care at the closest Urgent Care or Emergency Department to ensure appropriate treatment in a timely manner.  [facility] [place] has a 24 hour emergency room located on the ground  floor for your convenience.    Urgent Concerns During the Business Day Providers are seeing patients from 8AM to 5PM, Monday through Thursday, and 8AM to 12PM on Friday with a busy schedule and are most often not able to respond to non-urgent calls until the end of the day or the next business day. If you should have URGENT concerns during the day, please call and speak to the nurse or schedule a  same day appointment so that we can address your concern without delay.    Thank you, again, for choosing me as your health care partner. I appreciate your trust and look forward to learning more about you.    Miguel Arts, FNP-C

## 2024-03-16 ENCOUNTER — Ambulatory Visit: Admitting: Family Medicine

## 2024-06-06 ENCOUNTER — Emergency Department
Admission: EM | Admit: 2024-06-06 | Discharge: 2024-06-07 | Disposition: A | Discharge status: Other Acute Inpatient Hospital | Payer: Self-pay | Attending: Emergency Medicine | Admitting: Emergency Medicine

## 2024-06-06 ENCOUNTER — Other Ambulatory Visit: Payer: Self-pay

## 2024-06-06 DIAGNOSIS — R4789 Other speech disturbances: Secondary | ICD-10-CM | POA: Insufficient documentation

## 2024-06-06 DIAGNOSIS — F1210 Cannabis abuse, uncomplicated: Secondary | ICD-10-CM | POA: Insufficient documentation

## 2024-06-06 DIAGNOSIS — R4689 Other symptoms and signs involving appearance and behavior: Secondary | ICD-10-CM

## 2024-06-06 DIAGNOSIS — F39 Unspecified mood [affective] disorder: Secondary | ICD-10-CM

## 2024-06-06 DIAGNOSIS — Y900 Blood alcohol level of less than 20 mg/100 ml: Secondary | ICD-10-CM | POA: Insufficient documentation

## 2024-06-06 LAB — ACETAMINOPHEN LEVEL: Acetaminophen (Tylenol), Serum: <10 ug/mL — ABNORMAL LOW (ref 10–30)

## 2024-06-06 LAB — COMPREHENSIVE METABOLIC PANEL WITH GFR
ALT: 27 U/L (ref 0–44)
AST: 20 U/L (ref 15–41)
Albumin: 4.6 g/dL (ref 3.5–5.0)
Alkaline Phosphatase: 74 U/L (ref 38–126)
Anion gap: 13 (ref 5–15)
BUN: 11 mg/dL (ref 6–20)
CO2: 24 mmol/L (ref 22–32)
Calcium: 9.5 mg/dL (ref 8.9–10.3)
Chloride: 104 mmol/L (ref 98–111)
Creatinine, Ser: 0.93 mg/dL (ref 0.61–1.24)
GFR, Estimated: >60 mL/min
Glucose, Bld: 90 mg/dL (ref 70–99)
Potassium: 3.9 mmol/L (ref 3.5–5.1)
Sodium: 141 mmol/L (ref 135–145)
Total Bilirubin: 0.5 mg/dL (ref 0.0–1.2)
Total Protein: 7.2 g/dL (ref 6.5–8.1)

## 2024-06-06 LAB — CBC
HCT: 40.2 % (ref 39.0–52.0)
Hemoglobin: 12.7 g/dL — ABNORMAL LOW (ref 13.0–17.0)
MCH: 28 pg (ref 26.0–34.0)
MCHC: 31.6 g/dL (ref 30.0–36.0)
MCV: 88.5 fL (ref 80.0–100.0)
Platelets: 271 K/uL (ref 150–400)
RBC: 4.54 MIL/uL (ref 4.22–5.81)
RDW: 12.2 % (ref 11.5–15.5)
WBC: 5.5 K/uL (ref 4.0–10.5)
nRBC: 0 % (ref 0.0–0.2)

## 2024-06-06 LAB — URINE DRUG SCREEN
Amphetamines: NEGATIVE
Barbiturates: NEGATIVE
Benzodiazepines: NEGATIVE
Cocaine: NEGATIVE
Fentanyl: NEGATIVE
Methadone Scn, Ur: NEGATIVE
Opiates: NEGATIVE
Tetrahydrocannabinol: POSITIVE — AB

## 2024-06-06 LAB — SALICYLATE LEVEL: Salicylate Lvl: <7 mg/dL — ABNORMAL LOW (ref 7.0–30.0)

## 2024-06-06 LAB — ETHANOL: Alcohol, Ethyl (B): <15 mg/dL
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
## 2024-06-06 NOTE — ED Notes (Signed)
 ivc prior to arrival/psych consult ordered/pending.Marland KitchenMarland Kitchen
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
## 2024-06-06 NOTE — ED Notes (Signed)
 Pt belongings:  Phone Airpods Weight straps Brown pants Black tank top [employer]

## 2024-06-06 NOTE — ED Notes (Signed)
 Pt moved to [facility] with ED tech and security. Pt appears cooperative at this time. Pt oriented to unit and expectations; verbalized understanding.

## 2024-06-07 ENCOUNTER — Inpatient Hospital Stay
Admission: AD | Admit: 2024-06-07 | Discharge: 2024-06-10 | DRG: 897 | Disposition: A | Discharge status: Home / Self Care | Payer: 59 | Source: Intra-hospital | Attending: Child & Adolescent Psychiatry | Admitting: Child & Adolescent Psychiatry

## 2024-06-07 DIAGNOSIS — F39 Unspecified mood [affective] disorder: Principal | ICD-10-CM | POA: Diagnosis present

## 2024-06-07 DIAGNOSIS — M549 Dorsalgia, unspecified: Secondary | ICD-10-CM | POA: Diagnosis present

## 2024-06-07 DIAGNOSIS — R451 Restlessness and agitation: Secondary | ICD-10-CM | POA: Diagnosis present

## 2024-06-07 DIAGNOSIS — F329 Major depressive disorder, single episode, unspecified: Secondary | ICD-10-CM | POA: Diagnosis present

## 2024-06-07 DIAGNOSIS — F1210 Cannabis abuse, uncomplicated: Secondary | ICD-10-CM | POA: Diagnosis present

## 2024-06-07 DIAGNOSIS — F4322 Adjustment disorder with anxiety: Secondary | ICD-10-CM | POA: Diagnosis present

## 2024-06-07 DIAGNOSIS — Z630 Problems in relationship with spouse or partner: Secondary | ICD-10-CM

## 2024-06-07 DIAGNOSIS — F12188 Cannabis abuse with other cannabis-induced disorder: Principal | ICD-10-CM | POA: Diagnosis present

## 2024-06-07 MED ORDER — OLANZAPINE 5 MG PO TABS: 5.0000 mg | ORAL_TABLET | Freq: Three times a day (TID) | ORAL | Status: DC | PRN

## 2024-06-07 MED ORDER — HALOPERIDOL LACTATE 5 MG/ML IJ SOLN: 10.0000 mg | Freq: Three times a day (TID) | INTRAMUSCULAR | Status: DC | PRN

## 2024-06-07 MED ORDER — DIPHENHYDRAMINE HCL 50 MG/ML IJ SOLN: 50.0000 mg | Freq: Three times a day (TID) | INTRAMUSCULAR | Status: DC | PRN

## 2024-06-07 MED ORDER — TRAZODONE HCL 50 MG PO TABS
50.0000 mg | ORAL_TABLET | Freq: Every evening | ORAL | Status: DC | PRN
  Administered 2024-06-07 – 2024-06-09 (×3): 50 mg via ORAL
  Filled 2024-06-07 (×3): qty 1

## 2024-06-07 MED ORDER — LORAZEPAM 1 MG PO TABS: 1.0000 mg | ORAL_TABLET | Freq: Three times a day (TID) | ORAL | Status: DC | PRN

## 2024-06-07 MED ORDER — HYDROXYZINE HCL 25 MG PO TABS: 25.0000 mg | ORAL_TABLET | Freq: Three times a day (TID) | ORAL | Status: DC | PRN

## 2024-06-07 MED ORDER — MAGNESIUM HYDROXIDE 400 MG/5ML PO SUSP: 30.0000 mL | Freq: Every day | ORAL | Status: DC | PRN

## 2024-06-07 MED ORDER — LORAZEPAM 2 MG/ML IJ SOLN: 1.0000 mg | Freq: Three times a day (TID) | INTRAMUSCULAR | Status: DC | PRN

## 2024-06-07 MED ORDER — HALOPERIDOL LACTATE 5 MG/ML IJ SOLN: 5.0000 mg | Freq: Three times a day (TID) | INTRAMUSCULAR | Status: DC | PRN

## 2024-06-07 MED ORDER — HALOPERIDOL 5 MG PO TABS: 5.0000 mg | ORAL_TABLET | Freq: Three times a day (TID) | ORAL | Status: DC | PRN

## 2024-06-07 MED ORDER — LORAZEPAM 2 MG/ML IJ SOLN: 2.0000 mg | Freq: Three times a day (TID) | INTRAMUSCULAR | Status: DC | PRN

## 2024-06-07 MED ORDER — ALUM & MAG HYDROXIDE-SIMETH 200-200-20 MG/5ML PO SUSP: 30.0000 mL | ORAL | Status: DC | PRN

## 2024-06-07 MED ORDER — DIPHENHYDRAMINE HCL 25 MG PO CAPS: 50.0000 mg | ORAL_CAPSULE | Freq: Three times a day (TID) | ORAL | Status: DC | PRN

## 2024-06-07 MED ORDER — OLANZAPINE 10 MG IM SOLR: 5.0000 mg | Freq: Three times a day (TID) | INTRAMUSCULAR | Status: DC | PRN

## 2024-06-07 MED ORDER — ACETAMINOPHEN 325 MG PO TABS: 650.0000 mg | ORAL_TABLET | Freq: Four times a day (QID) | ORAL | Status: DC | PRN

## 2024-06-07 NOTE — Progress Notes (Signed)
 Patient visited by DSS.

## 2024-06-07 NOTE — Group Note (Signed)
 [facility] LCSW Group Therapy Note   Group Date: 06/07/2024 Start Time: 1300 End Time: 1340   Type of Therapy/Topic:  Group Therapy:  Emotion Regulation  Participation Level:  Did Not Attend   Description of Group:    The purpose of this group is to assist patients in learning to regulate negative emotions and experience positive emotions. Patients will be guided to discuss ways in which they have been vulnerable to their negative emotions. These vulnerabilities will be juxtaposed with experiences of positive emotions or situations, and patients challenged to use positive emotions to combat negative ones. Special emphasis will be placed on coping with negative emotions in conflict situations, and patients will process healthy conflict resolution skills.  Therapeutic Goals: Patient will identify two positive emotions or experiences to reflect on in order to balance out negative emotions:  Patient will label two or more emotions that they find the most difficult to experience:  Patient will be able to demonstrate positive conflict resolution skills through discussion or role plays:   Summary of Patient Progress: Patient did not attend group.   Therapeutic Modalities:   Cognitive Behavioral Therapy Feelings Identification Dialectical Behavioral Therapy   Miguel [name] Fam, LCSW

## 2024-06-07 NOTE — Group Note (Signed)
 Date:  06/07/2024 Time:  4:15 PM  Group Topic/Focus:  Self Care:   The focus of this group is to help patients understand the importance of self-care in order to improve or restore emotional, physical, spiritual, interpersonal, and financial health.    Participation Level:  Did Not Attend   Miguel Smith June 06/07/2024, 4:15 PM

## 2024-06-07 NOTE — ED Notes (Signed)
 ivc/consult done/recommended for inpatient psychiatric for safety & stabilization.

## 2024-06-07 NOTE — ED Notes (Signed)
 TTS at the bedside with IRIS ipad for pt evaluation

## 2024-06-07 NOTE — ED Notes (Addendum)
TTS at the bedside. 

## 2024-06-07 NOTE — ED Notes (Signed)
 Pt notified that he will be admitted to BMU. Pt reports concerns about calling his job and sister. Pt became irritable during interaction. RN able to verbally de escalate at this time. Pt agreeable to plan at this time to admit to BMU.
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
## 2024-06-07 NOTE — BH Assessment (Signed)
 Patient has been accepted to [facility] 06/07/24   Patient assigned to room 314A  Accepting physician is Dr. Ruther   Call report to [phone].   Representative was [facility] Danika.       ER Staff is aware.

## 2024-06-07 NOTE — Plan of Care (Signed)
   Problem: Education: Goal: Emotional status will improve Outcome: Progressing Goal: Mental status will improve Outcome: Progressing Goal: Verbalization of understanding the information provided will improve Outcome: Progressing   Problem: Activity: Goal: Interest or engagement in activities will improve Outcome: Progressing

## 2024-06-07 NOTE — Group Note (Signed)
 Date:  06/07/2024 Time:  12:47 PM  Group Topic/Focus:  Personal Choices and Values:   The focus of this group is to help patients assess and explore the importance of values in their lives, how their values affect their decisions, how they express their values and what opposes their expression.    Participation Level:  Did Not Attend   Miguel Smith June 06/07/2024, 12:47 PM

## 2024-06-08 DIAGNOSIS — F1290 Cannabis use, unspecified, uncomplicated: Secondary | ICD-10-CM

## 2024-06-08 DIAGNOSIS — F329 Major depressive disorder, single episode, unspecified: Secondary | ICD-10-CM | POA: Diagnosis not present

## 2024-06-08 NOTE — Plan of Care (Signed)

## 2024-06-08 NOTE — Progress Notes (Signed)
" °   06/08/24 1700  Psych Admission Type (Psych Patients Only)  Admission Status Involuntary  Psychosocial Assessment  Patient Complaints Anxiety  Eye Contact Fair  Facial Expression Worried  Affect Anxious  Speech Logical/coherent  Interaction Isolative  Motor Activity Slow  Appearance/Hygiene Unremarkable  Behavior Characteristics Cooperative  Mood Pleasant  Thought Process  Coherency WDL  Content WDL  Delusions None reported or observed  Perception WDL  Hallucination None reported or observed  Judgment Impaired  Confusion WDL  Danger to Self  Current suicidal ideation? Denies  Danger to Others  Danger to Others None reported or observed    "
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
## 2024-06-08 NOTE — Group Note (Signed)
 Date:  06/08/2024 Time:  8:44 PM  Group Topic/Focus:  Conflict Resolution:   The focus of this group is to discuss the conflict resolution process and how it may be used upon discharge.    Pt did not attend group.  Miguel Smith L 06/08/2024, 8:44 PM

## 2024-06-08 NOTE — Group Note (Signed)
 [facility] LCSW Group Therapy Note   Group Date: 06/08/2024 Start Time: 1000 End Time: 1100   Type of Therapy/Topic:  Group Therapy:  Balance in Life  Participation Level:  Did Not Attend   Description of Group:    This group will address the concept of balance and how it feels and looks when one is unbalanced. Patients will be encouraged to process areas in their lives that are out of balance, and identify reasons for remaining unbalanced. Facilitators will guide patients utilizing problem- solving interventions to address and correct the stressor making their life unbalanced. Understanding and applying boundaries will be explored and addressed for obtaining  and maintaining a balanced life. Patients will be encouraged to explore ways to assertively make their unbalanced needs known to significant others in their lives, using other group members and facilitator for support and feedback.  Therapeutic Goals: Patient will identify two or more emotions or situations they have that consume much of in their lives. Patient will identify signs/triggers that life has become out of balance:  Patient will identify two ways to set boundaries in order to achieve balance in their lives:  Patient will demonstrate ability to communicate their needs through discussion and/or role plays  Summary of Patient Progress:    Patient did not attend.     Therapeutic Modalities:   Cognitive Behavioral Therapy Solution-Focused Therapy Assertiveness Training   Alveta CHRISTELLA Kerns, LCSW

## 2024-06-08 NOTE — Group Note (Signed)
 Date:  06/08/2024 Time:  10:46 AM  Group Topic/Focus:  MTH went over the rules and expectations on this unit for the patients, Identified who the nurses and techs were while also answering patient questions.    Participation Level:  Did Not Attend  Miguel Smith 06/08/2024, 10:46 AM

## 2024-06-08 NOTE — Group Note (Signed)
 Recreation Therapy Group Note   Group Topic:General Recreation  Group Date: 06/08/2024 Start Time: 1445 End Time: 1515 Facilitators: Celestia Jeoffrey BRAVO, LRT, CTRS Location: Courtyard  Group Description: [employer]. LRT and patients played games of basketball, drew with chalk, and played corn hole while outside in the courtyard while getting fresh air and sunlight. Music was being played in the background. LRT and peers conversed about different games they have played before, what they do in their free time and anything else that is on their minds. LRT encouraged pts to drink water after being outside, sweating and getting their heart rate up.  Goal Area(s) Addressed: Patient will build on frustration tolerance skills. Patients will partake in a competitive play game with peers. Patients will gain knowledge of new leisure interest/hobby.    Affect/Mood: N/A   Participation Level: Did not attend    Clinical Observations/Individualized Feedback: Patient did not attend.  Plan: Continue to engage patient in RT group sessions 2-3x/week.   Jeoffrey BRAVO Celestia, LRT, CTRS 06/08/2024 4:48 PM

## 2024-06-08 NOTE — Progress Notes (Signed)
" °   06/08/24 2100  Psychosocial Assessment  Patient Complaints Anxiety  Eye Contact Fair  Facial Expression Anxious  Affect Anxious  Speech Logical/coherent  Interaction Isolative  Motor Activity Slow  Appearance/Hygiene Unremarkable  Behavior Characteristics Cooperative  Mood Pleasant  Thought Process  Coherency WDL  Content WDL  Delusions None reported or observed  Perception WDL  Hallucination None reported or observed  Judgment Impaired  Confusion None  Danger to Self  Current suicidal ideation? Denies  Danger to Others  Danger to Others None reported or observed    "
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
## 2024-06-08 NOTE — Progress Notes (Signed)
" °   06/08/24 0109  Psych Admission Type (Psych Patients Only)  Admission Status Involuntary  Psychosocial Assessment  Patient Complaints Anxiety  Eye Contact Fair  Facial Expression Worried  Affect Anxious  Speech Logical/coherent  Interaction Assertive  Motor Activity Other (Comment) (WDL)  Appearance/Hygiene Unremarkable;In scrubs  Behavior Characteristics Cooperative;Appropriate to situation  Mood Pleasant  Aggressive Behavior  Effect No apparent injury  Thought Process  Coherency WDL  Content WDL  Delusions None reported or observed  Perception WDL  Hallucination None reported or observed  Judgment Impaired  Confusion WDL  Danger to Self  Current suicidal ideation? Denies  Agreement Not to Harm Self Yes  Description of Agreement Verbal  Danger to Others  Danger to Others None reported or observed    "

## 2024-06-08 NOTE — Group Note (Signed)
 Date:  06/08/2024 Time:  4:04 AM  Group Topic/Focus:  Goals Group:   The focus of this group is to help patients establish daily goals to achieve during treatment and discuss how the patient can incorporate goal setting into their daily lives to aide in recovery. Wrap-Up Group:   The focus of this group is to help patients review their daily goal of treatment and discuss progress on daily workbooks.    Pt did not attend group.  Goodwin Kamphaus L 06/08/2024, 4:04 AM
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
## 2024-06-08 NOTE — Group Note (Signed)
 Recreation Therapy Group Note   Group Topic:Communication  Group Date: 06/08/2024 Start Time: 1410 End Time: 1445 Facilitators: Celestia Jeoffrey BRAVO, LRT, CTRS Location: Craft Room  Group Description: Resolution Charades. Each group member writes down a New Years resolution theyre considering (e.g., spend more time outdoors or practice mindfulness) onto a small slip of paper. Resolutions are folded and placed in a bowl. One person picks a resolution and acts it out without words while the group guesses. Once the resolution is guessed, the group discusses how achieving this goal could be meaningful.  Goal Area(s) Addressed:  [employer] and self-expression. Create a supportive space for sharing personal goals. Encourage reflection on individual aspirations. Nonverbal communication. Active listening, teamwork and team building.   Affect/Mood: N/A   Participation Level: Did not attend    Clinical Observations/Individualized Feedback: Patient did not attend group.   Plan: Continue to engage patient in RT group sessions 2-3x/week.   Jeoffrey BRAVO Celestia, LRT, CTRS 06/08/2024 4:01 PM

## 2024-06-08 NOTE — Plan of Care (Signed)
" °  Problem: Education: Goal: Knowledge of [facility] Education information/materials will improve 06/08/2024 2149 by Trudy Arland CROME, RN Outcome: Progressing 06/08/2024 1121 by Trudy Arland CROME, RN Outcome: Progressing Goal: Emotional status will improve 06/08/2024 2149 by Trudy Arland CROME, RN Outcome: Progressing 06/08/2024 1121 by Trudy Arland CROME, RN Outcome: Progressing Goal: Mental status will improve 06/08/2024 2149 by Trudy Arland CROME, RN Outcome: Progressing 06/08/2024 1121 by Trudy Arland CROME, RN Outcome: Progressing Goal: Verbalization of understanding the information provided will improve 06/08/2024 2149 by Trudy Arland CROME, RN Outcome: Progressing 06/08/2024 1121 by Trudy Arland CROME, RN Outcome: Progressing   Problem: Activity: Goal: Interest or engagement in activities will improve 06/08/2024 2149 by Trudy Arland CROME, RN Outcome: Progressing 06/08/2024 1121 by Trudy Arland CROME, RN Outcome: Progressing Goal: Sleeping patterns will improve 06/08/2024 2149 by Trudy Arland CROME, RN Outcome: Progressing 06/08/2024 1121 by Trudy Arland CROME, RN Outcome: Progressing   Problem: Coping: Goal: Ability to verbalize frustrations and anger appropriately will improve 06/08/2024 2149 by Trudy Arland CROME, RN Outcome: Progressing 06/08/2024 1121 by Trudy Arland CROME, RN Outcome: Progressing Goal: Ability to demonstrate self-control will improve 06/08/2024 2149 by Trudy Arland CROME, RN Outcome: Progressing 06/08/2024 1121 by Trudy Arland CROME, RN Outcome: Progressing   Problem: Health Behavior/Discharge Planning: Goal: Identification of resources available to assist in meeting health care needs will improve 06/08/2024 2149 by Trudy Arland CROME, RN Outcome: Progressing 06/08/2024 1121 by Trudy Arland CROME, RN Outcome: Progressing Goal: Compliance with treatment plan for underlying cause of condition will improve 06/08/2024 2149 by Trudy Arland CROME, RN Outcome: Progressing 06/08/2024  1121 by Trudy Arland CROME, RN Outcome: Progressing   Problem: Physical Regulation: Goal: Ability to maintain clinical measurements within normal limits will improve 06/08/2024 2149 by Trudy Arland CROME, RN Outcome: Progressing 06/08/2024 1121 by Trudy Arland CROME, RN Outcome: Progressing   Problem: Safety: Goal: Periods of time without injury will increase 06/08/2024 2149 by Trudy Arland CROME, RN Outcome: Progressing 06/08/2024 1121 by Trudy Arland CROME, RN Outcome: Progressing   "

## 2024-06-09 DIAGNOSIS — F329 Major depressive disorder, single episode, unspecified: Secondary | ICD-10-CM | POA: Diagnosis not present

## 2024-06-09 DIAGNOSIS — F1290 Cannabis use, unspecified, uncomplicated: Secondary | ICD-10-CM | POA: Diagnosis not present

## 2024-06-09 NOTE — BH IP Treatment Plan (Signed)
 Interdisciplinary Treatment and Diagnostic Plan Update  06/09/2024 Time of Session: 10:05 LADEN FIELDHOUSE MRN: [id number]  Principal Diagnosis: Unspecified mood (affective) disorder  Secondary Diagnoses: Principal Problem:   Unspecified mood (affective) disorder   Current Medications:  Current Facility-Administered Medications  Medication Dose Route Frequency Provider Last Rate Last Admin   acetaminophen  (TYLENOL ) tablet 650 mg  650 mg Oral Q6H PRN Smith, Annie B, NP       alum & mag hydroxide-simeth (MAALOX/MYLANTA) 200-200-20 MG/5ML suspension 30 mL  30 mL Oral Q4H PRN Smith, Annie B, NP       haloperidol (HALDOL) tablet 5 mg  5 mg Oral TID PRN Smith, Annie B, NP       And   diphenhydrAMINE (BENADRYL) capsule 50 mg  50 mg Oral TID PRN Smith, Annie B, NP       haloperidol lactate (HALDOL) injection 5 mg  5 mg Intramuscular TID PRN Smith, Annie B, NP       And   diphenhydrAMINE (BENADRYL) injection 50 mg  50 mg Intramuscular TID PRN Smith, Annie B, NP       And   LORazepam (ATIVAN) injection 2 mg  2 mg Intramuscular TID PRN Smith, Annie B, NP       haloperidol lactate (HALDOL) injection 10 mg  10 mg Intramuscular TID PRN Smith, Annie B, NP       And   diphenhydrAMINE (BENADRYL) injection 50 mg  50 mg Intramuscular TID PRN Smith, Annie B, NP       And   LORazepam (ATIVAN) injection 2 mg  2 mg Intramuscular TID PRN Smith, Annie B, NP       hydrOXYzine  (ATARAX ) tablet 25 mg  25 mg Oral TID PRN Smith, Annie B, NP       magnesium  hydroxide (MILK OF MAGNESIA) suspension 30 mL  30 mL Oral Daily PRN Smith, Annie B, NP       traZODone (DESYREL) tablet 50 mg  50 mg Oral QHS PRN Smith, Annie B, NP   50 mg at 06/08/24 2113   PTA Medications: Medications Prior to Admission  Medication Sig Dispense Refill Last Dose/Taking   albuterol  (VENTOLIN  HFA) 108 (90 Base) MCG/ACT inhaler Inhale 2 puffs into the lungs every 6 (six) hours as needed for wheezing or shortness of breath. (Patient not  taking: Reported on 06/07/2024) 8 g 2    methylPREDNISolone  (MEDROL  DOSEPAK) 4 MG TBPK tablet Take as package directed (Patient not taking: Reported on 06/07/2024) 21 tablet 0     Patient Stressors: Marital or family conflict    Patient Strengths: Ability for insight  Forensic Psychologist fund of knowledge   Treatment Modalities: Medication Management, Group therapy, Case management,  1 to 1 session with clinician, Psychoeducation, Recreational therapy.   Physician Treatment Plan for Primary Diagnosis: Unspecified mood (affective) disorder Long Term Goal(s):     Short Term Goals:    Medication Management: Evaluate patient's response, side effects, and tolerance of medication regimen.  Therapeutic Interventions: 1 to 1 sessions, Unit Group sessions and Medication administration.  Evaluation of Outcomes: Progressing  Physician Treatment Plan for Secondary Diagnosis: Principal Problem:   Unspecified mood (affective) disorder  Long Term Goal(s):     Short Term Goals:       Medication Management: Evaluate patient's response, side effects, and tolerance of medication regimen.  Therapeutic Interventions: 1 to 1 sessions, Unit Group sessions and Medication administration.  Evaluation of Outcomes: Progressing   RN Treatment Plan for Primary  Diagnosis: Unspecified mood (affective) disorder Long Term Goal(s): Knowledge of disease and therapeutic regimen to maintain health will improve  Short Term Goals: Ability to remain free from injury will improve, Ability to verbalize frustration and anger appropriately will improve, Ability to demonstrate self-control, Ability to participate in decision making will improve, Ability to verbalize feelings will improve, Ability to disclose and discuss suicidal ideas, Ability to identify and develop effective coping behaviors will improve, and Compliance with prescribed medications will improve  Medication Management: RN will administer  medications as ordered by provider, will assess and evaluate patient's response and provide education to patient for prescribed medication. RN will report any adverse and/or side effects to prescribing provider.  Therapeutic Interventions: 1 on 1 counseling sessions, Psychoeducation, Medication administration, Evaluate responses to treatment, Monitor vital signs and CBGs as ordered, Perform/monitor CIWA, COWS, AIMS and Fall Risk screenings as ordered, Perform wound care treatments as ordered.  Evaluation of Outcomes: Progressing   LCSW Treatment Plan for Primary Diagnosis: Unspecified mood (affective) disorder Long Term Goal(s): Safe transition to appropriate next level of care at discharge, Engage patient in therapeutic group addressing interpersonal concerns.  Short Term Goals: Engage patient in aftercare planning with referrals and resources, Increase social support, Increase ability to appropriately verbalize feelings, Increase emotional regulation, Facilitate acceptance of mental health diagnosis and concerns, Facilitate patient progression through stages of change regarding substance use diagnoses and concerns, Identify triggers associated with mental health/substance abuse issues, and Increase skills for wellness and recovery  Therapeutic Interventions: Assess for all discharge needs, 1 to 1 time with Social worker, Explore available resources and support systems, Assess for adequacy in community support network, Educate family and significant other(s) on suicide prevention, Complete Psychosocial Assessment, Interpersonal group therapy.  Evaluation of Outcomes: Progressing   Progress in Treatment: Attending groups: No. Participating in groups: No. Taking medication as prescribed: Yes. Toleration medication: Yes. Family/Significant other contact made: No, will contact:  when given permission.  Patient understands diagnosis: No. Discussing patient identified problems/goals with staff:  Yes. Medical problems stabilized or resolved: Yes. Denies suicidal/homicidal ideation: Yes. Issues/concerns per patient self-inventory: No. Other: none.  New problem(s) identified: No, Describe:  none identified.  New Short Term/Long Term Goal(s): medication management for mood stabilization; elimination of SI thoughts; development of comprehensive mental wellness/sobriety plan.  Patient Goals:  To go home.   Discharge Plan or Barriers: CSW will assist pt with development of an appropriate aftercare/discharge plan.   Reason for Continuation of Hospitalization: Depression Medication stabilization  Estimated Length of Stay: 1-7 days  Last 3 [place] Suicide Severity Risk Score: Flowsheet Row Admission (Current) from 06/07/2024 in [facility] ED from 06[facility]06/2024 in [facility] Department at [facility] from 02/10/2024 in [facility]   C-SSRS RISK CATEGORY No Risk No Risk No Risk    Last PHQ 2/9 Scores:    02/17/2024   11:02 AM  Depression screen PHQ 2/9  Decreased Interest 2  Down, Depressed, Hopeless 1  PHQ - 2 Score 3  Altered sleeping 1  Tired, decreased energy 0  Change in appetite 0  Feeling bad or failure about yourself  0  Trouble concentrating 0  Moving slowly or fidgety/restless 0  Suicidal thoughts 0  PHQ-9 Score 4   Difficult doing work/chores Not difficult at all     Data saved with a previous flowsheet row definition    Scribe for Treatment Team: Nadara [name] Fam, KEN 06/09/2024 12:53 PM

## 2024-06-09 NOTE — Group Note (Signed)
 Date:  06/09/2024 Time:  9:46 PM  Group Topic/Focus:  Coping With Mental Health Crisis:   The purpose of this group is to help patients identify strategies for coping with mental health crisis.  Group discusses possible causes of crisis and ways to manage them effectively. Managing Feelings:   The focus of this group is to identify what feelings patients have difficulty handling and develop a plan to handle them in a healthier way upon discharge.    Participation Level:  Minimal  Participation Quality:  Attentive  Affect:  Appropriate and Blunted  Cognitive:  Alert  Insight: Appropriate  Engagement in Group:  Engaged and Limited  Modes of Intervention:  Support  Additional Comments:    Eran Mistry L 06/09/2024, 9:46 PM

## 2024-06-09 NOTE — Progress Notes (Signed)
 Patient is isolative to his room requesting about his discharge. Discharge discussed in team meeting. Support provided as needed. Denies SI/HI/A/VH and verbally contracts for safety.

## 2024-06-09 NOTE — Group Note (Signed)
 Date:  06/09/2024 Time:  6:58 PM  Group Topic/Focus:  Wellness Toolbox:   The focus of this group is to discuss various aspects of wellness, balancing those aspects and exploring ways to increase the ability to experience wellness.  Patients will create a wellness toolbox for use upon discharge.    Participation Level:  Did Not Attend   Miguel Smith Miguel Smith 06/09/2024, 6:58 PM

## 2024-06-09 NOTE — Plan of Care (Signed)
   Problem: Education: Goal: Emotional status will improve Outcome: Progressing Goal: Mental status will improve Outcome: Progressing Goal: Verbalization of understanding the information provided will improve Outcome: Progressing   Problem: Activity: Goal: Interest or engagement in activities will improve Outcome: Progressing

## 2024-06-09 NOTE — Group Note (Signed)
 Date:  06/09/2024 Time:  10:13 AM  Group Topic/Focus:  Goals Group:   The focus of this group is to help patients establish daily goals to achieve during treatment and discuss how the patient can incorporate goal setting into their daily lives to aide in recovery.    Participation Level:  Did Not Attend   Clayborne DELENA June 06/09/2024, 10:13 AM

## 2024-06-09 NOTE — Progress Notes (Signed)
 Pt calm and pleasant during assessment denying SI/HI/AVH. Pt observed by this Clinical research associate interacting appropriately with staff and peers on the unit. Pt compliant with medication administration per MD orders. Pt given education, support, and encouragement to be active in his treatment plan. Pt being monitored Q 15 minutes for safety per unit protocol, remains safe on the unit

## 2024-06-09 NOTE — BHH Counselor (Signed)
 CSW spoke with Ibraheem Voris, sister, [phone] .   She reports that she was not present for the event at patient's home.  She reports that she was on the phone with pt's mother when she arrived at the patient's home.   She reports that per her understanding pt took the phone from his child and tablet from his partner.  She reports that pt allegedly threatened to end it all during the conversation.  She reports that pt needs to follow up with outpatient providers following this encounter.  She reports concern that patient may be on drugs and that plays a part on how he acts.  She reports that patient has a history of anger issues.   She reports that she is not sure what has happened to his weapon. She reports that she thought that she was under the impression that police was taking it and he would need to go court to get the weapon.   She reports you never know if he is saying stuff for the shock factor or what, I don't know if he says this stuff being genuine or because he wants to scare her (partner).    Sherryle Margo, MSW, LCSW 06/09/2024 2:00 PM

## 2024-06-09 NOTE — BHH Counselor (Signed)
 CSW spoke with Delwin Johnson, father, [phone].  Father reports that he was already staying with me before this incident.  He reports that he encouraged the pt to address issues with pts partner to address any issues to keep patient from becoming like him.   He reports that he does not have any concerns in the home.    He reports that he is grown and his mother intervenes too much for him.  He reports that he has two registered weapons in the home that are kept in a gun safe that the patient does not have access to.   He reports that he would like to pick the patient up at discharge,  Sherryle Margo, MSW, LCSW 06/09/2024 2:49 PM

## 2024-06-09 NOTE — BHH Suicide Risk Assessment (Signed)
 BHH INPATIENT:  Family/Significant Other Suicide Prevention Education  Suicide Prevention Education:  Contact Attempts: Miguel Smith, sister, [phone], (name of family member/significant other) has been identified by the patient as the family member/significant other with whom the patient will be residing, and identified as the person(s) who will aid the patient in the event of a mental health crisis.  With written consent from the patient, two attempts were made to provide suicide prevention education, prior to and/or following the patient's discharge.  We were unsuccessful in providing suicide prevention education.  A suicide education pamphlet was given to the patient to share with family/significant other.  Date and time of first attempt:06/09/2024 at  1:50PM Date and time of second attempt: Second attempt is needed.   CSW left HIPAA compliant voicemail.  [name] 06/09/2024, 1:49 PM

## 2024-06-09 NOTE — Group Note (Signed)
 Recreation Therapy Group Note   Group Topic:Stress Management  Group Date: 06/09/2024 Start Time: 1530 End Time: 1630 Facilitators: Celestia Jeoffrey BRAVO, LRT, CTRS Location: Dayroom  Group Description: Rummy. LRT and pts played games of Rummy. LRT prompted group discussion on the physical signs and symptoms of stress, like those you feel when playing a competitive card game. LRT and pt discussed the physical and mental signs of stress, as well as coping skills to manage them.   Goal Area(s) Addressed: Patient will identify physical symptoms of stress. Patient will identify coping skills for stress. Patient will build frustration tolerance skills.  Patient will increase communication.    Affect/Mood: Appropriate   Participation Level: Active and Engaged   Participation Quality: Independent   Behavior: Calm and Cooperative   Speech/Thought Process: Coherent   Insight: Good   Judgement: Good   Modes of Intervention: Competitive Play   Patient Response to Interventions:  Attentive, Engaged, Interested , and Receptive   Education Outcome:  Acknowledges education   Clinical Observations/Individualized Feedback: Miguel Smith was active in their participation of session activities and group discussion. Pt interacted well with LRT and peers duration of session.    Plan: Continue to engage patient in RT group sessions 2-3x/week.   Jeoffrey BRAVO Celestia, LRT, CTRS 06/09/2024 5:21 PM

## 2024-06-09 NOTE — Plan of Care (Signed)
 Miguel Smith is a 24 y.o. male patient. No diagnosis found. History reviewed. No pertinent past medical history. Current Facility-Administered Medications  Medication Dose Route Frequency Provider Last Rate Last Admin   acetaminophen  (TYLENOL ) tablet 650 mg  650 mg Oral Q6H PRN Smith, Annie B, NP       alum & mag hydroxide-simeth (MAALOX/MYLANTA) 200-200-20 MG/5ML suspension 30 mL  30 mL Oral Q4H PRN Smith, Annie B, NP       haloperidol (HALDOL) tablet 5 mg  5 mg Oral TID PRN Smith, Annie B, NP       And   diphenhydrAMINE (BENADRYL) capsule 50 mg  50 mg Oral TID PRN Smith, Annie B, NP       haloperidol lactate (HALDOL) injection 5 mg  5 mg Intramuscular TID PRN Smith, Annie B, NP       And   diphenhydrAMINE (BENADRYL) injection 50 mg  50 mg Intramuscular TID PRN Smith, Annie B, NP       And   LORazepam (ATIVAN) injection 2 mg  2 mg Intramuscular TID PRN Smith, Annie B, NP       haloperidol lactate (HALDOL) injection 10 mg  10 mg Intramuscular TID PRN Smith, Annie B, NP       And   diphenhydrAMINE (BENADRYL) injection 50 mg  50 mg Intramuscular TID PRN Smith, Annie B, NP       And   LORazepam (ATIVAN) injection 2 mg  2 mg Intramuscular TID PRN Smith, Annie B, NP       hydrOXYzine  (ATARAX ) tablet 25 mg  25 mg Oral TID PRN Smith, Annie B, NP       magnesium  hydroxide (MILK OF MAGNESIA) suspension 30 mL  30 mL Oral Daily PRN Smith, Annie B, NP       traZODone (DESYREL) tablet 50 mg  50 mg Oral QHS PRN Smith, Annie B, NP   50 mg at 06/08/24 2113   Allergies[1] Principal Problem:   Unspecified mood (affective) disorder  Blood pressure (!) 140/74, pulse (!) 58, temperature 98.4 F (36.9 C), temperature source Oral, resp. rate 18, height 6' (1.829 m), weight 100.2 kg, SpO2 99%.    Miguel Smith B Miguel Smith 06/09/2024      [1] No Known Allergies

## 2024-06-09 NOTE — Group Note (Signed)
 Recreation Therapy Group Note   Group Topic:Leisure Education  Group Date: 06/09/2024 Start Time: 1020 End Time: 1115 Facilitators: Celestia Jeoffrey BRAVO, LRT, CTRS Location: Craft Room  Group Description: Leisure. Patients were given the option to choose from journaling, coloring, drawing, making origami, playing with playdoh, listening to music or singing karaoke. LRT and pts discussed the meaning of leisure, the importance of participating in leisure during their free time/when they're outside of the hospital, as well as how our leisure interests can also serve as coping skills.   Goal Area(s) Addressed:  Patient will identify a current leisure interest.  Patient will learn the definition of leisure. Patient will practice making a positive decision. Patient will have the opportunity to try a new leisure activity. Patient will communicate with peers and LRT.    Affect/Mood: N/A   Participation Level: Did not attend    Clinical Observations/Individualized Feedback: Patient did not attend.  Plan: Continue to engage patient in RT group sessions 2-3x/week.   Jeoffrey BRAVO Celestia, LRT, CTRS 06/09/2024 11:43 AM

## 2024-06-09 NOTE — BHH Counselor (Addendum)
 ADDENDUM CSW spoke with Candace with [facility] [employer],   She reports that the police seized the weapon for safety.  However, it can be released back to the patient once he is released from the hospital and requests.  She reports that the patient will have to go through a background check and ATF check to obtain his weapon, though once/if passed he can get his weapon.  Sherryle Margo, MSW, LCSW 06/09/2024 4:19 PM     CSW attempted to contact the patient's partner, Deatrice Fickle, [phone].   CSW left HIPAA compliant voicemail.  Call was to confirm location/safety of the weapon.  Patient provided verbal consent to speak with his partner.   Sherryle Margo, MSW, LCSW 06/09/2024 3:52 PM

## 2024-06-10 DIAGNOSIS — F39 Unspecified mood [affective] disorder: Secondary | ICD-10-CM

## 2024-06-10 MED ORDER — ALBUTEROL SULFATE HFA 108 (90 BASE) MCG/ACT IN AERS: 2.0000 | INHALATION_SPRAY | Freq: Four times a day (QID) | RESPIRATORY_TRACT | 2 refills | Status: AC | PRN

## 2024-06-10 NOTE — Group Note (Signed)
 Date:  06/10/2024 Time:  2:06 PM  Group Topic/Focus:  Goals Group:   The focus of this group is to help patients establish daily goals to achieve during treatment and discuss how the patient can incorporate goal setting into their daily lives to aide in recovery.    Participation Level:  Active  Participation Quality:  Appropriate  Affect:  Appropriate  Cognitive:  Appropriate  Insight: Appropriate  Engagement in Group:  Engaged  Modes of Intervention:  Activity  Additional Comments:    Miguel Smith Name 06/10/2024, 2:06 PM

## 2024-06-10 NOTE — Progress Notes (Signed)
" °  [facility] Adult Case Management Discharge Plan :  Will you be returning to the same living situation after discharge:  Yes,  Father Annabelle Louder [phone] At discharge, do you have transportation home?: Yes,  sister Cheston Coury [phone] Do you have the ability to pay for your medications: Yes,  pt states he is not taking any medications, but family can help if he needs them.   Release of information consent forms completed and in the chart;  Patient's signature needed at discharge.  Patient to Follow up at:  Follow-up Information     [facility] Follow up.   Why: [facility] is scheduled for 06/19/2024 @ 1pm virtually Contact information: [address]  [address] [place] [address] [phone]                 Next level of care provider has access to [facility] Link:yes  Safety Planning and Suicide Prevention discussed: Yes,   Delwin Johnson, father, [phone]     Has patient been referred to the Quitline?: Patient refused referral for treatment  Patient has been referred for addiction treatment: No known substance use disorder.  Pamila Nine, LCSW 06/10/2024, 1:40 PM "
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
## 2024-06-10 NOTE — [facility] Suicide Risk Assessment (Signed)
 [facility] Discharge Suicide Risk Assessment   Principal Problem: Unspecified mood (affective) disorder Discharge Diagnoses: Principal Problem:   Unspecified mood (affective) disorder   Total Time spent with patient: 30 minutes  Musculoskeletal: Strength & Muscle Tone: within normal limits Gait & Station: normal Patient leans: N/A  Psychiatric Specialty Exam  Presentation  General Appearance:  Appropriate for Environment; Casual  Eye Contact: Good  Speech: Clear and Coherent; Normal Rate  Speech Volume: Normal  Handedness:No data recorded  Mood and Affect  Mood: Euthymic  Duration of Depression Symptoms: No data recorded Affect: Appropriate   Thought Process  Thought Processes: Coherent; Goal Directed; Linear  Descriptions of Associations:Intact  Orientation:Full (Time, Place and Person)  Thought Content:WDL  History of Schizophrenia/Schizoaffective disorder:No data recorded Duration of Psychotic Symptoms:No data recorded Hallucinations:Hallucinations: None  Ideas of Reference:None  Suicidal Thoughts:Suicidal Thoughts: No  Homicidal Thoughts:Homicidal Thoughts: No   Sensorium  Memory: Immediate Fair; Recent Fair  Judgment: Fair  Insight: Fair   Art Therapist  Concentration: Fair  Attention Span: Fair  Recall: [employer] of Knowledge: Fair  Language: Fair   Psychomotor Activity  Psychomotor Activity: Psychomotor Activity: Normal   Assets  Assets: Manufacturing Systems Engineer; Housing; Social Support   Sleep  Sleep: Sleep: Fair  Estimated Sleeping Duration (Last 24 Hours): 5.50-6.75 hours  Physical Exam: Physical Exam Vitals and nursing note reviewed.  Constitutional:      Appearance: Normal appearance.  Cardiovascular:     Rate and Rhythm: Normal rate.  Pulmonary:     Effort: Pulmonary effort is normal.  Neurological:     Mental Status: He is alert.  Psychiatric:        Mood and Affect: Mood normal.         Behavior: Behavior normal.        Thought Content: Thought content normal.    Review of Systems  Respiratory:  Negative for shortness of breath.   Cardiovascular:  Negative for chest pain.  Gastrointestinal:  Negative for diarrhea, nausea and vomiting.  Psychiatric/Behavioral:  Negative for depression, hallucinations and suicidal ideas. The patient is not nervous/anxious.   All other systems reviewed and are negative.  Blood pressure 125/78, pulse 71, temperature 98.1 F (36.7 C), temperature source Oral, resp. rate 19, height 6' (1.829 m), weight 100.2 kg, SpO2 100%. Body mass index is 29.97 kg/m.  Mental Status Per Nursing Assessment::   On Admission:  Suicidal ideation indicated by others  Demographic Factors:  Male, Adolescent or young adult, and Access to firearms  Loss Factors: NA  Historical Factors: Impulsivity  Risk Reduction Factors:   Sense of responsibility to family, Living with another person, especially a relative, and Positive social support  Continued Clinical Symptoms:  Previous Psychiatric Diagnoses and Treatments  Cognitive Features That Contribute To Risk:  None    Suicide Risk:  Minimal: No identifiable suicidal ideation.  Patients presenting with no risk factors but with morbid ruminations; Miguel Smith be classified as minimal risk based on the severity of the depressive symptoms   Follow-up Information     [facility] Follow up.   Why: [facility] is scheduled for 06/19/2024 @ 1pm virtually Contact information: [address]  [address] [place] [address] [phone]                 Plan Of Care/Follow-up recommendations:  Follow up with outpatient providers listed above.    Miguel Kanner, NP 06/10/2024, 2:08 PM

## 2024-06-10 NOTE — Plan of Care (Signed)
   Problem: Education: Goal: Emotional status will improve Outcome: Progressing Goal: Mental status will improve Outcome: Progressing

## 2024-06-10 NOTE — Progress Notes (Signed)
 Patient ID: Miguel Smith, male   DOB: Oct 13, 2000, 24 y.o.   MRN: [id number]  Discharge Note:  Patient denies SI/HI/AVH at this time. Discharge instructions, AVS, prescriptions, and transition record gone over with patient. Patient declined on completing a Suicide Safety Plan. Patient agrees to comply with follow-up visit, and outpatient therapy. Patient belongings returned to patient. Patient questions and concerns addressed and answered. Patient ambulatory off unit. Patient discharged to home with his sister.

## 2024-06-10 NOTE — Progress Notes (Signed)
 This clinical research associate assumed care of this patient at 1210 and patient has been observed in the dayroom, interacting with peers and in no acute distress. Patient remains safe on the unit and will continu to be monitored for safety.
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
